# Patient Record
Sex: Female | Born: 1955 | Race: White | Hispanic: No | Smoking: Former smoker
Health system: Southern US, Community
[De-identification: ages and names within clinical notes are randomized; demographics above are authoritative.]

## PROBLEM LIST (undated history)

## (undated) DIAGNOSIS — F419 Anxiety disorder, unspecified: Secondary | ICD-10-CM

## (undated) DIAGNOSIS — F329 Major depressive disorder, single episode, unspecified: Secondary | ICD-10-CM

## (undated) DIAGNOSIS — I714 Abdominal aortic aneurysm, without rupture, unspecified: Secondary | ICD-10-CM

## (undated) DIAGNOSIS — I739 Peripheral vascular disease, unspecified: Secondary | ICD-10-CM

## (undated) DIAGNOSIS — F32A Depression, unspecified: Secondary | ICD-10-CM

## (undated) DIAGNOSIS — I219 Acute myocardial infarction, unspecified: Secondary | ICD-10-CM

## (undated) DIAGNOSIS — K219 Gastro-esophageal reflux disease without esophagitis: Secondary | ICD-10-CM

## (undated) DIAGNOSIS — I6529 Occlusion and stenosis of unspecified carotid artery: Secondary | ICD-10-CM

## (undated) DIAGNOSIS — I719 Aortic aneurysm of unspecified site, without rupture: Secondary | ICD-10-CM

## (undated) DIAGNOSIS — I2510 Atherosclerotic heart disease of native coronary artery without angina pectoris: Secondary | ICD-10-CM

## (undated) DIAGNOSIS — M47816 Spondylosis without myelopathy or radiculopathy, lumbar region: Secondary | ICD-10-CM

## (undated) HISTORY — DX: Peripheral vascular disease, unspecified: I73.9

## (undated) HISTORY — PX: SPINE SURGERY: SHX786

## (undated) HISTORY — PX: ABDOMINAL HYSTERECTOMY: SHX81

## (undated) HISTORY — DX: Acute myocardial infarction, unspecified: I21.9

## (undated) HISTORY — PX: BACK SURGERY: SHX140

## (undated) HISTORY — DX: Abdominal aortic aneurysm, without rupture, unspecified: I71.40

## (undated) HISTORY — PX: CHOLECYSTECTOMY: SHX55

## (undated) HISTORY — DX: Spondylosis without myelopathy or radiculopathy, lumbar region: M47.816

## (undated) HISTORY — DX: Abdominal aortic aneurysm, without rupture: I71.4

---

## 1998-06-20 ENCOUNTER — Ambulatory Visit (HOSPITAL_COMMUNITY): Admission: RE | Admit: 1998-06-20 | Discharge: 1998-06-20 | Payer: Self-pay | Admitting: Neurosurgery

## 1998-07-31 ENCOUNTER — Ambulatory Visit (HOSPITAL_COMMUNITY): Admission: RE | Admit: 1998-07-31 | Discharge: 1998-08-01 | Payer: Self-pay | Admitting: Neurosurgery

## 1998-11-06 ENCOUNTER — Ambulatory Visit (HOSPITAL_COMMUNITY): Admission: RE | Admit: 1998-11-06 | Discharge: 1998-11-06 | Payer: Self-pay | Admitting: Neurosurgery

## 1999-07-04 ENCOUNTER — Emergency Department (HOSPITAL_COMMUNITY): Admission: EM | Admit: 1999-07-04 | Discharge: 1999-07-04 | Payer: Self-pay | Admitting: Emergency Medicine

## 1999-08-13 ENCOUNTER — Emergency Department (HOSPITAL_COMMUNITY): Admission: EM | Admit: 1999-08-13 | Discharge: 1999-08-13 | Payer: Self-pay | Admitting: Emergency Medicine

## 1999-08-15 ENCOUNTER — Encounter: Payer: Self-pay | Admitting: Neurosurgery

## 1999-08-15 ENCOUNTER — Inpatient Hospital Stay (HOSPITAL_COMMUNITY): Admission: EM | Admit: 1999-08-15 | Discharge: 1999-08-18 | Payer: Self-pay | Admitting: Emergency Medicine

## 1999-08-17 ENCOUNTER — Encounter: Payer: Self-pay | Admitting: Neurosurgery

## 2001-09-23 ENCOUNTER — Encounter: Payer: Self-pay | Admitting: Emergency Medicine

## 2001-09-23 ENCOUNTER — Emergency Department (HOSPITAL_COMMUNITY): Admission: EM | Admit: 2001-09-23 | Discharge: 2001-09-23 | Payer: Self-pay | Admitting: Emergency Medicine

## 2001-10-15 ENCOUNTER — Emergency Department (HOSPITAL_COMMUNITY): Admission: EM | Admit: 2001-10-15 | Discharge: 2001-10-15 | Payer: Self-pay

## 2001-10-24 ENCOUNTER — Emergency Department (HOSPITAL_COMMUNITY): Admission: EM | Admit: 2001-10-24 | Discharge: 2001-10-24 | Payer: Self-pay | Admitting: *Deleted

## 2001-11-04 ENCOUNTER — Encounter: Payer: Self-pay | Admitting: Neurosurgery

## 2001-11-04 ENCOUNTER — Ambulatory Visit (HOSPITAL_COMMUNITY): Admission: RE | Admit: 2001-11-04 | Discharge: 2001-11-04 | Payer: Self-pay | Admitting: Neurosurgery

## 2001-11-16 ENCOUNTER — Inpatient Hospital Stay (HOSPITAL_COMMUNITY): Admission: RE | Admit: 2001-11-16 | Discharge: 2001-11-17 | Payer: Self-pay | Admitting: Neurosurgery

## 2001-11-16 ENCOUNTER — Encounter: Payer: Self-pay | Admitting: Neurosurgery

## 2001-11-24 ENCOUNTER — Ambulatory Visit (HOSPITAL_COMMUNITY): Admission: RE | Admit: 2001-11-24 | Discharge: 2001-11-24 | Payer: Self-pay | Admitting: Neurosurgery

## 2003-03-22 ENCOUNTER — Encounter: Admission: RE | Admit: 2003-03-22 | Discharge: 2003-03-22 | Payer: Self-pay | Admitting: Internal Medicine

## 2003-03-27 ENCOUNTER — Ambulatory Visit (HOSPITAL_COMMUNITY): Admission: RE | Admit: 2003-03-27 | Discharge: 2003-03-27 | Payer: Self-pay | Admitting: Internal Medicine

## 2003-05-21 ENCOUNTER — Encounter: Admission: RE | Admit: 2003-05-21 | Discharge: 2003-05-21 | Payer: Self-pay | Admitting: Internal Medicine

## 2003-06-26 ENCOUNTER — Encounter: Admission: RE | Admit: 2003-06-26 | Discharge: 2003-06-26 | Payer: Self-pay | Admitting: Internal Medicine

## 2003-11-22 ENCOUNTER — Emergency Department (HOSPITAL_COMMUNITY): Admission: EM | Admit: 2003-11-22 | Discharge: 2003-11-22 | Payer: Self-pay | Admitting: Family Medicine

## 2004-04-15 ENCOUNTER — Emergency Department (HOSPITAL_COMMUNITY): Admission: EM | Admit: 2004-04-15 | Discharge: 2004-04-15 | Payer: Self-pay | Admitting: Family Medicine

## 2007-08-07 ENCOUNTER — Emergency Department (HOSPITAL_COMMUNITY): Admission: EM | Admit: 2007-08-07 | Discharge: 2007-08-07 | Payer: Self-pay | Admitting: Emergency Medicine

## 2008-03-29 ENCOUNTER — Emergency Department (HOSPITAL_COMMUNITY): Admission: EM | Admit: 2008-03-29 | Discharge: 2008-03-29 | Payer: Self-pay | Admitting: Emergency Medicine

## 2008-04-20 ENCOUNTER — Emergency Department (HOSPITAL_COMMUNITY): Admission: EM | Admit: 2008-04-20 | Discharge: 2008-04-20 | Payer: Self-pay | Admitting: Emergency Medicine

## 2008-07-02 ENCOUNTER — Encounter: Admission: RE | Admit: 2008-07-02 | Discharge: 2008-07-02 | Payer: Self-pay | Admitting: Orthopaedic Surgery

## 2008-07-24 ENCOUNTER — Emergency Department (HOSPITAL_COMMUNITY): Admission: EM | Admit: 2008-07-24 | Discharge: 2008-07-24 | Payer: Self-pay | Admitting: Family Medicine

## 2008-07-30 ENCOUNTER — Ambulatory Visit: Payer: Self-pay | Admitting: *Deleted

## 2008-07-30 ENCOUNTER — Inpatient Hospital Stay (HOSPITAL_COMMUNITY): Admission: EM | Admit: 2008-07-30 | Discharge: 2008-08-02 | Payer: Self-pay | Admitting: Emergency Medicine

## 2008-08-01 ENCOUNTER — Encounter (INDEPENDENT_AMBULATORY_CARE_PROVIDER_SITE_OTHER): Payer: Self-pay | Admitting: *Deleted

## 2008-08-04 ENCOUNTER — Encounter (INDEPENDENT_AMBULATORY_CARE_PROVIDER_SITE_OTHER): Payer: Self-pay | Admitting: Internal Medicine

## 2008-08-07 ENCOUNTER — Ambulatory Visit: Payer: Self-pay

## 2008-08-07 ENCOUNTER — Encounter (INDEPENDENT_AMBULATORY_CARE_PROVIDER_SITE_OTHER): Payer: Self-pay | Admitting: Internal Medicine

## 2009-04-27 ENCOUNTER — Emergency Department (HOSPITAL_COMMUNITY): Admission: EM | Admit: 2009-04-27 | Discharge: 2009-04-27 | Payer: Self-pay | Admitting: Family Medicine

## 2009-12-03 ENCOUNTER — Emergency Department (HOSPITAL_COMMUNITY): Admission: EM | Admit: 2009-12-03 | Discharge: 2009-12-04 | Payer: Self-pay | Admitting: Emergency Medicine

## 2010-03-14 ENCOUNTER — Emergency Department (HOSPITAL_COMMUNITY): Admission: EM | Admit: 2010-03-14 | Discharge: 2010-03-14 | Payer: Self-pay | Admitting: Emergency Medicine

## 2010-03-18 ENCOUNTER — Emergency Department (HOSPITAL_COMMUNITY): Admission: EM | Admit: 2010-03-18 | Discharge: 2010-03-18 | Payer: Self-pay | Admitting: Family Medicine

## 2010-04-20 ENCOUNTER — Emergency Department (HOSPITAL_COMMUNITY): Admission: EM | Admit: 2010-04-20 | Discharge: 2010-04-20 | Payer: Self-pay | Admitting: Emergency Medicine

## 2010-08-03 ENCOUNTER — Emergency Department (HOSPITAL_COMMUNITY): Admission: EM | Admit: 2010-08-03 | Discharge: 2010-08-03 | Payer: Self-pay | Admitting: Emergency Medicine

## 2010-08-06 ENCOUNTER — Emergency Department (HOSPITAL_COMMUNITY): Admission: EM | Admit: 2010-08-06 | Discharge: 2010-08-06 | Payer: Self-pay | Admitting: Family Medicine

## 2010-10-06 ENCOUNTER — Emergency Department (HOSPITAL_COMMUNITY)
Admission: EM | Admit: 2010-10-06 | Discharge: 2010-10-06 | Payer: Self-pay | Source: Home / Self Care | Admitting: Emergency Medicine

## 2010-10-25 ENCOUNTER — Encounter: Payer: Self-pay | Admitting: Family Medicine

## 2010-12-14 LAB — BASIC METABOLIC PANEL
BUN: 11 mg/dL (ref 6–23)
CO2: 22 mEq/L (ref 19–32)
Calcium: 8.4 mg/dL (ref 8.4–10.5)
Chloride: 105 mEq/L (ref 96–112)
Creatinine, Ser: 1 mg/dL (ref 0.4–1.2)
GFR calc Af Amer: >60 mL/min (ref >60)
GFR calc non Af Amer: 58 mL/min — ABNORMAL LOW (ref >60)
Glucose, Bld: 74 mg/dL (ref 70–99)
Potassium: 4 mEq/L (ref 3.5–5.1)
Sodium: 136 mEq/L (ref 135–145)

## 2010-12-14 LAB — DIFFERENTIAL
Basophils Absolute: 0.1 10*3/uL (ref 0.0–0.1)
Basophils Relative: 1 % (ref 0–1)
Eosinophils Absolute: 0.8 10*3/uL — ABNORMAL HIGH (ref 0.0–0.7)
Eosinophils Relative: 8 % — ABNORMAL HIGH (ref 0–5)
Lymphocytes Relative: 33 % (ref 12–46)
Lymphs Abs: 3.2 10*3/uL (ref 0.7–4.0)
Monocytes Absolute: 0.5 10*3/uL (ref 0.1–1.0)
Monocytes Relative: 5 % (ref 3–12)
Neutro Abs: 5 10*3/uL (ref 1.7–7.7)
Neutrophils Relative %: 53 % (ref 43–77)

## 2010-12-14 LAB — CBC
HCT: 38.4 % (ref 36.0–46.0)
Hemoglobin: 12.3 g/dL (ref 12.0–15.0)
MCH: 29.1 pg (ref 26.0–34.0)
MCHC: 32 g/dL (ref 30.0–36.0)
MCV: 90.8 fL (ref 78.0–100.0)
Platelets: 238 10*3/uL (ref 150–400)
RBC: 4.23 MIL/uL (ref 3.87–5.11)
RDW: 14.6 % (ref 11.5–15.5)
WBC: 9.5 10*3/uL (ref 4.0–10.5)

## 2010-12-14 LAB — POCT CARDIAC MARKERS
CKMB, poc: <1 ng/mL — ABNORMAL LOW (ref 1.0–8.0)
Myoglobin, poc: 55.8 ng/mL (ref 12–200)
Troponin i, poc: <0.05 ng/mL (ref 0.00–0.09)

## 2010-12-19 LAB — POCT CARDIAC MARKERS
Myoglobin, poc: 77.9 ng/mL (ref 12–200)
Troponin i, poc: <0.05 ng/mL (ref 0.00–0.09)

## 2010-12-19 LAB — POCT I-STAT, CHEM 8
BUN: 10 mg/dL (ref 6–23)
HCT: 38 % (ref 36.0–46.0)
Hemoglobin: 12.9 g/dL (ref 12.0–15.0)
Sodium: 140 mEq/L (ref 135–145)
TCO2: 27 mmol/L (ref 0–100)

## 2010-12-19 LAB — DIFFERENTIAL
Basophils Absolute: 0.1 10*3/uL (ref 0.0–0.1)
Basophils Relative: 1 % (ref 0–1)
Eosinophils Absolute: 0.6 10*3/uL (ref 0.0–0.7)
Eosinophils Relative: 6 % — ABNORMAL HIGH (ref 0–5)
Lymphocytes Relative: 23 % (ref 12–46)
Lymphs Abs: 2.2 10*3/uL (ref 0.7–4.0)
Monocytes Absolute: 0.6 10*3/uL (ref 0.1–1.0)
Monocytes Relative: 6 % (ref 3–12)
Neutro Abs: 6 10*3/uL (ref 1.7–7.7)
Neutrophils Relative %: 64 % (ref 43–77)

## 2010-12-19 LAB — CBC
HCT: 37.4 % (ref 36.0–46.0)
Hemoglobin: 12.6 g/dL (ref 12.0–15.0)
MCH: 30 pg (ref 26.0–34.0)
MCHC: 33.8 g/dL (ref 30.0–36.0)
MCV: 88.8 fL (ref 78.0–100.0)
Platelets: 228 10*3/uL (ref 150–400)
RBC: 4.21 MIL/uL (ref 3.87–5.11)
RDW: 14.2 % (ref 11.5–15.5)
WBC: 9.4 10*3/uL (ref 4.0–10.5)

## 2010-12-19 LAB — LIPASE, BLOOD: Lipase: 25 U/L (ref 11–59)

## 2010-12-21 LAB — URINALYSIS, ROUTINE W REFLEX MICROSCOPIC
Bilirubin Urine: NEGATIVE
Hgb urine dipstick: NEGATIVE
Nitrite: NEGATIVE
Specific Gravity, Urine: 1.026 (ref 1.005–1.030)
pH: 6 (ref 5.0–8.0)

## 2010-12-21 LAB — DIFFERENTIAL
Lymphocytes Relative: 43 % (ref 12–46)
Lymphs Abs: 3.4 10*3/uL (ref 0.7–4.0)
Monocytes Relative: 6 % (ref 3–12)
Neutro Abs: 3.5 10*3/uL (ref 1.7–7.7)
Neutrophils Relative %: 44 % (ref 43–77)

## 2010-12-21 LAB — POCT I-STAT, CHEM 8
Chloride: 116 mEq/L — ABNORMAL HIGH (ref 96–112)
Creatinine, Ser: 0.7 mg/dL (ref 0.4–1.2)
Glucose, Bld: 92 mg/dL (ref 70–99)
Potassium: 3.3 mEq/L — ABNORMAL LOW (ref 3.5–5.1)

## 2010-12-21 LAB — CBC
RBC: 4.13 MIL/uL (ref 3.87–5.11)
WBC: 8 10*3/uL (ref 4.0–10.5)

## 2011-02-19 NOTE — Op Note (Signed)
[place]. [facility]  Patient:    [name], [name] Visit Number: [id number] MRN: [id number]          Service Type: SUR Location: 3000 [id number] Attending Physician:  Barton Fanny Dictated by:   Hewitt Shorts, M.D. Proc. Date: 11/16/01 Admit Date:  11/16/2001                             Operative Report  PREOPERATIVE DIAGNOSIS:  Recurrent L5-S1 lumbar disk herniation.  POSTOPERATIVE DIAGNOSIS:  Recurrent L5-S1 lumbar disk herniation.  OPERATION PERFORMED:  Left L5-S1 lumbar laminectomy and microdiskectomy.  SURGEON:  Hewitt Shorts, M.D.  ASSISTANT:  Cristi Loron, M.D.  ANESTHESIA:  General endotracheal.  INDICATIONS FOR PROCEDURE:  The patient is a 55 year old woman who presented with recurrent left lumbar radicular pain and was found by MRI scan to have a large recurrent left L5-S1 lumbar disk herniation.  Decision was made to proceed with elective laminotomy and microdiskectomy.  DESCRIPTION OF PROCEDURE:  The patient was brought to the operating room and placed under general endotracheal anesthesia.  The patient was turned to a prone position.  The lumbar region was prepped with Betadine soap and solution and draped in sterile fashion.  The midline was infiltrated with local anesthetic with epinephrine.  The previous midline incision was made and carried down through the subcutaneous tissues.  Bipolar cautery and electrocautery were used to maintain hemostasis.  Dissection was carried down to the lumbar fascia which was incised from the left side to the midline and then the paraspinal muscles were dissected from the spinous processes and lamina in the subperiosteal fashion.  A self-retaining retractor was placed and the microscope draped and brought into the field to provide additional magnification, illumination and visualization and the remainder of the procedure was performed using microdissection and microsurgical  technique. We identified the previous laminotomy and an x-ray was taken to confirm the localization.  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  This central fragment was removed and spondylitic overgrowth that had resulted from that was removed as well.  We were able to begin to explore the epidural space.  The left L5 and left S1 nerve root were identified as well as the thecal sac.  Careful dissection was carried out in the ventral epidural  space and we were able to expose the disk herniation which was adherent to the ventral surface of the thecal sac.  Several large fragments of disk material were removed.  We then entered into the disk space and removed additional degenerated disk material. A thorough diskectomy was performed removing all loose fragments of disk material in both the disk space and the epidural space.  In the end, good decompression of the thecal sac and nerve root was achieved.  The wound was then irrigated extensively with bacitracin solution and checked for hemostasis and then we proceeded with closure.  The deep fascia was closed with interrupted undyed 0 Vicryl suture, the subcutaneous and subcuticular layer were closed with interrupted inverted 2-0 undyed Vicryl sutures and the skin edges were approximated with Dermabond.  The patient tolerated the procedure well.  Estimated blood loss was less than 50 cc.  Sponge, needle and instrument counts were correct.  Following surgery the patient was turned back to a supine position to be reversed from anesthetic, extubated and transferred to  the recovery room for further care. Dictated by:   Hewitt Shorts, M.D. Attending Physician:  Barton Fanny DD:  11/16/01 TD:  11/16/01 Job: [id number]

## 2011-02-19 NOTE — H&P (Signed)
[place]. [facility]  Patient:    [name]                         MRN: [id number] Adm. Date:  [id number] Attending:  Barton Fanny CC:         Hewitt Shorts, M.D.                         History and Physical  HISTORY OF PRESENT ILLNESS:  Patient is a 55 year old right-handed white female who presented to [facility] Emergency Room for evaluation.  She says that two months ago she injured her back when she was changing a tire on her car. She felt a grabbing pain in the left side of her low back and subsequently had stiffness that would tend to come and go.  Two days ago though, she awoke with severe pain into the left lower extremity with pain into the left buttock, thigh and leg.  At this point, the worst of the pain is in the left leg.  She describes it as a burning searing pain.  She does not describe any numbness or paresthesias but she has a sense of weakness in her left lower extremity.  She has been using Ultram at home without relief and has been unable to find a comfortable position.  PAST MEDICAL HISTORY:  There is Alvarado history of hypertension, myocardial infarction, cancer, stroke, diabetes, peptic ulcer disease or lung disease.  PREVIOUS SURGICAL HISTORY:  C5-6 and C6-7 anterior cervical diskectomies and arthrodeses with bone graft and plating by Cristi Loron, M.D. in October f 1999.  She is also status post hysterectomy and cholecystectomy.  ALLERGIES:  She denies any allergies to medications; however, she does have an allergy to EGGS.  MEDICATIONS:  Her only current medication is Ultram.  FAMILY HISTORY:  Her mother died at age 79; she had diabetes mellitus.  Her father died at age 75; he had prostate cancer.  SOCIAL HISTORY:  Patient is divorced.  She has three grown children, one of whom lives with her.  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  She smokes a pack a day; she has been smoking for 16 years.  She does not drink alcoholic beverages.  REVIEW OF SYSTEMS:  Notable for those difficulties described in her history of present illness and past medical history but is otherwise unremarkable.  PHYSICAL EXAMINATION:  GENERAL:  Patient is a well-developed, well-nourished white female in obvious pain and discomfort.  VITAL SIGNS:  Temperature is 98.5, pulse 89, blood pressure 129/71, respiratory  rate 20.  LUNGS:  Clear to auscultation.  HEART:  Regular rate and rhythm.  Normal S1 and S2.  There is Alvarado murmur.  ABDOMEN:  Soft, nondistended.  Bowel sounds are present.  EXTREMITIES:  Alvarado clubbing, cyanosis, or edema.  VASCULAR:  Examination shows 1 to 2+ pulses at the dorsalis pedis and posterior  tibials bilaterally.  MUSCULOSKELETAL:  Alvarado tenderness to palpation over the lumbar spinous processes r paravertebral musculature.  She has a positive straight leg raising on the left at 20 degrees and a positive crossed straight leg raising on the right with pain into the left lower extremity at about 40 degrees.  NEUROLOGIC:  Mild weakness of the left extensor hallucis longus, 4-/5; however, the left dorsiflexion and plantarflexion are 5/5 in  all the musculature and the distal right lower extremity is 5/5.  Sensory examination shows intact sensation to pinprick.  Reflexes are 1 to 2 at the quadriceps and gastrocnemius bilaterally.  Toes are downgoing bilaterally.  IMPRESSION:  Acute left lumbar radiculopathy with extensor hallucis longus weakness.  PLAN:  The patient will be admitted for parenteral analgesics.  MRI of the lumbar spine without gadolinium will be obtained.  She will be placed on bedrest with bathroom privileges and further recommendations await the results of her MRI. Admission laboratories including a lumbosacral spine x-ray series with flexion nd extension views will be done. DD:   08/15/99 TD:  08/16/99 Job: [id number]

## 2011-02-19 NOTE — Discharge Summary (Signed)
[name], [name] NO.:  [id number]   MEDICAL RECORD NO.:  [id number]          PATIENT TYPE:  INP   LOCATION:  3709                         FACILITY:  [facility]   PHYSICIAN:  Manning Charity, MD     DATE OF BIRTH:  25-Dec-1955   DATE OF ADMISSION:  07/30/2008  DATE OF DISCHARGE:  08/02/2008                               DISCHARGE SUMMARY   DISCHARGE DIAGNOSES  1. Acute upper respiratory tract infection.  2. Hyperlipidemia.  3. Tobacco abuse, ongoing.  4. History of cholecystectomy in 1985.  5. History of surgery for cervical spine in 2000.  6. Surgery for lumbar disk herniation in 2002.  7. History of hysterectomy in 1995.  8. Abdominal aortic aneurysm (3.1-mm size).  9. Chronic lower back pain and chronic left lower extremity pain.   DISCHARGE MEDICATIONS   1. Avelox 400 mg once daily for 3 more days.  2. Multivitamins 1 tablet once daily.  3. Prednisone 20 mg tomorrow and 10 mg the next day and then stop.  4. Albuterol inhaler 1 puff every 4 hours as needed for shortness of      breath or wheezing.  5. Aspirin 81 mg 1 pill once daily.  6. Pravastatin 40 mg 1 pill once daily.   DISPOSITION AND FOLLOWUP   The patient is to follow up with Outpatient [facility]  on August 27, 2008 at 2 p.m. with Dr. Elizebeth Alvarado and the patient is to  follow up with [facility] on [facility] for stress test on  August 08, 2008 at 8 a.m.   PROCEDURES PERFORMED  1. Chest x-ray.  Impression:  Peribronchial thickening, no active      disease.  2. Ultrasound abdomen.  Impression:  No acute findings.  Liver appears      normal.  Status post cholecystectomy.  Distal abdominal aorta is      minimally dilated (2.6 cm).  3. Cardiac enzymes, point of care markers, so CK-MB 1.5, troponin I      0.05, myoglobin 97.9, D-dimer 0.64.  fasting lipid profile, total      cholesterol 201, triglycerides 98, HDL cholesterol 34, LDL      cholesterol 149,  and VLDL  cholesterol 18.  Second set of cardiac      enzymes, total creatinine kinase 158, CK-MB 1.1, troponin I 0.02.      BNP 69, hemoglobin A1c 5.3.  TSH 0.443.  Free T4 0.96.  HIV      antibody nonreactive.  Second set of cardiac enzymes, total      creatinine 63, CK-MB 0.7, troponin I less than 0.01.  Third set of      cardiac enzymes, total creatinine kinase 42, CK-MB 0.8, troponin I      0.01.  Acute hepatitis panel, hepatitis B surface antigen negative,      hepatitis B core antibody negative, hepatitis A antibody negative,      hepatitis C antibody negative, and blood cultures negative.   CONSULTATIONS  None.   BRIEF ADMITTING HISTORY AND PHYSICAL   A 52-year lady with no  significant past medical history except for back  surgeries, comes to the emergency department with chief complaints of  cough for the last 3 weeks and shortness of breath over the last 1 week.  Complaint started 3 weeks back with cough, productive with mucoid  sputum.  Shortness of breath started 1 week back, gradually worsening,  increased with walking and decreased by resting.  The patient walks in  hemodialysis in [place] and could not go to work over the last 1  week.  The patient was seen in Urgent Care 1 week back for cough and  shortness of breath and was started on Tamiflu.  She finished her last  dose of Tamiflu today.  The patient complains of nausea and vomiting  secondary to prolonged coughing.  The patient denies any runny nose,  fever, or chills.  The patient complains that she noticed a change in  her voice over the last 1 week and feels lack of energy all the day.  The patient was seen in the Urgent Care Clinic today and EKG showed ST  changes in inferolateral leads.  The patient denies any chest pain, but  complains of palpitations associated with the shortness of breath and  cough.  She denies any other complaints.  She complains of hoarseness of  voice for the last 1 week.   PHYSICAL  EXAMINATION  VITAL SIGNS:  Temperature 98.1, blood pressure 181/96, pulse 86,  respiratory rate 18, and oxygen saturation 97% on room air.  GENERAL:  She was lying on the bed, not in any acute distress.  Coughing  occasionally.  EYES:  Pupils equal, round and reacting to light.  SKIN:  No jaundice.  ENT:  Angular cheilitis.  Mild hoarseness of voice.  NECK:  No JVD.  No carotid bruits.  Neck is supple.  RESPIRATORY:  Bilateral diffuse wheezing and occasional fine basilar  crackles.  No rhonchi.  Good air movement bilaterally.  CARDIOVASCULAR:  S1 and S2.  Regular rate and rhythm.  No murmurs.  GASTROINTESTINAL:  Abdomen is soft, nondistended, and nontender.  Bowel  sounds positive.  EXTREMITIES:  No pedal edema.  NEUROLOGICAL:  Nonfocal.  PSYCH:  Appropriate.   LABS AT THE TIME OF ADMISSION   White count 8.8, hemoglobin is 14.6, hematocrit 43.6, and platelet count  226.  Comprehensive metabolic panel, sodium 140, potassium 3.3, chloride  108, carbon dioxide 25, glucose 87, BUN 9, creatinine 0.5, total  bilirubin is 0.3, alkaline phosphatase 97, AST 0.8, ALT 0.52, total  protein 7.3, albumin 3.3, and calcium 9.1.   HOSPITAL COURSE   1. Acute respiratory tract infection.  Given the patient's history of      cough with productive cough, was started on empiric antibiotic      treatment with Avelox and started on prednisone taper.  We started      on prednisone taper, given wheezing on physical examination and      history of tobacco abuse for a long time.  The patient responded      excellently to the antibiotic and steroid treatment and the patient      did not have any complaints in the remaining part of the hospital      stay.  Secondary to the patient's ST-segment changes during the      hospital stay, we admitted her to Telemetry and did cardiac enzymes      x3.  Cardiac enzymes were negative.  The patient is to follow up  with [facility] for a stress test on  August 08, 2008, at 8      a.m.  The patient is to continue the prednisone taper as well as to      finish the full course of antibiotic therapy.  2. Hyperlipidemia.  We started her on pravastatin 40 mg once daily.   DISCHARGE VITALS   Temperature 97.0, blood pressure 112/57, pulse rate 61, respiratory rate  20, and oxygen saturation 95% on room air.   The day of discharge, the patient is stable, alert, and oriented and is  not in any acute distress.  The patient is to follow up with Outpatient  Clinic at [facility] on August 27, 2008, and is to follow up  with [facility] for stress test on August 08, 2008.      Blondell Reveal, MD  Electronically Signed      Manning Charity, MD  Electronically Signed    VB/MEDQ  D:  09/19/2008  T:  09/20/2008  Job:  [id number]

## 2011-03-09 ENCOUNTER — Emergency Department (HOSPITAL_COMMUNITY)
Admission: EM | Admit: 2011-03-09 | Discharge: 2011-03-09 | Disposition: A | Discharge status: Home / Self Care | Payer: Self-pay | Attending: Emergency Medicine | Admitting: Emergency Medicine

## 2011-03-09 DIAGNOSIS — M549 Dorsalgia, unspecified: Secondary | ICD-10-CM | POA: Insufficient documentation

## 2011-03-09 DIAGNOSIS — F17200 Nicotine dependence, unspecified, uncomplicated: Secondary | ICD-10-CM | POA: Insufficient documentation

## 2011-03-25 ENCOUNTER — Other Ambulatory Visit (HOSPITAL_COMMUNITY): Payer: Self-pay | Admitting: Orthopaedic Surgery

## 2011-03-25 DIAGNOSIS — I739 Peripheral vascular disease, unspecified: Secondary | ICD-10-CM

## 2011-03-26 ENCOUNTER — Ambulatory Visit (HOSPITAL_COMMUNITY)
Admission: RE | Admit: 2011-03-26 | Discharge: 2011-03-26 | Disposition: A | Discharge status: Home / Self Care | Payer: Self-pay | Source: Ambulatory Visit | Attending: Orthopaedic Surgery | Admitting: Orthopaedic Surgery

## 2011-03-26 ENCOUNTER — Other Ambulatory Visit (HOSPITAL_COMMUNITY): Payer: Self-pay | Admitting: Orthopaedic Surgery

## 2011-03-26 DIAGNOSIS — I739 Peripheral vascular disease, unspecified: Secondary | ICD-10-CM | POA: Insufficient documentation

## 2011-03-26 DIAGNOSIS — I743 Embolism and thrombosis of arteries of the lower extremities: Secondary | ICD-10-CM | POA: Insufficient documentation

## 2011-04-10 ENCOUNTER — Encounter: Payer: Self-pay | Admitting: Emergency Medicine

## 2011-04-10 ENCOUNTER — Emergency Department (HOSPITAL_COMMUNITY)
Admission: EM | Admit: 2011-04-10 | Discharge: 2011-04-10 | Disposition: A | Discharge status: Home / Self Care | Payer: Self-pay | Attending: Emergency Medicine | Admitting: Emergency Medicine

## 2011-04-10 ENCOUNTER — Emergency Department (HOSPITAL_COMMUNITY): Payer: Self-pay

## 2011-04-10 DIAGNOSIS — R1084 Generalized abdominal pain: Secondary | ICD-10-CM

## 2011-04-10 DIAGNOSIS — Z79899 Other long term (current) drug therapy: Secondary | ICD-10-CM | POA: Insufficient documentation

## 2011-04-10 DIAGNOSIS — R109 Unspecified abdominal pain: Secondary | ICD-10-CM | POA: Insufficient documentation

## 2011-04-10 DIAGNOSIS — F17200 Nicotine dependence, unspecified, uncomplicated: Secondary | ICD-10-CM | POA: Insufficient documentation

## 2011-04-10 HISTORY — DX: Anxiety disorder, unspecified: F41.9

## 2011-04-10 HISTORY — DX: Depression, unspecified: F32.A

## 2011-04-10 HISTORY — DX: Gastro-esophageal reflux disease without esophagitis: K21.9

## 2011-04-10 HISTORY — DX: Aortic aneurysm of unspecified site, without rupture: I71.9

## 2011-04-10 HISTORY — DX: Major depressive disorder, single episode, unspecified: F32.9

## 2011-04-10 LAB — BASIC METABOLIC PANEL
BUN: 17 mg/dL (ref 6–23)
CO2: 24 mEq/L (ref 19–32)
GFR calc non Af Amer: 59 mL/min — ABNORMAL LOW (ref >60)
Glucose, Bld: 75 mg/dL (ref 70–99)
Potassium: 4.2 mEq/L (ref 3.5–5.1)

## 2011-04-10 LAB — URINE MICROSCOPIC-ADD ON

## 2011-04-10 LAB — DIFFERENTIAL
Eosinophils Absolute: 1.5 10*3/uL — ABNORMAL HIGH (ref 0.0–0.7)
Lymphocytes Relative: 36 % (ref 12–46)
Lymphs Abs: 3.4 10*3/uL (ref 0.7–4.0)
Monocytes Relative: 6 % (ref 3–12)
Neutrophils Relative %: 42 % — ABNORMAL LOW (ref 43–77)

## 2011-04-10 LAB — URINALYSIS, ROUTINE W REFLEX MICROSCOPIC
Bilirubin Urine: NEGATIVE
Glucose, UA: NEGATIVE mg/dL
Specific Gravity, Urine: 1.02 (ref 1.005–1.030)
pH: 5.5 (ref 5.0–8.0)

## 2011-04-10 LAB — CBC
Hemoglobin: 11.8 g/dL — ABNORMAL LOW (ref 12.0–15.0)
MCH: 26.2 pg (ref 26.0–34.0)
MCV: 82.7 fL (ref 78.0–100.0)
RBC: 4.51 MIL/uL (ref 3.87–5.11)
WBC: 9.5 10*3/uL (ref 4.0–10.5)

## 2011-04-10 MED ORDER — SODIUM CHLORIDE 0.9 % IV SOLN
999.0000 mL | INTRAVENOUS | Status: DC
  Administered 2011-04-10: 999 mL via INTRAVENOUS

## 2011-04-10 MED ORDER — ONDANSETRON HCL 4 MG PO TABS: 4.0000 mg | ORAL_TABLET | Freq: Four times a day (QID) | ORAL | Status: AC

## 2011-04-10 MED ORDER — HYDROMORPHONE HCL 2 MG PO TABS: 2.0000 mg | ORAL_TABLET | ORAL | Status: AC | PRN

## 2011-04-10 MED ORDER — IOHEXOL 300 MG/ML  SOLN: 100.0000 mL | Freq: Once | INTRAMUSCULAR | Status: DC | PRN

## 2011-04-10 NOTE — ED Notes (Signed)
Patient is resting comfortably. 

## 2011-04-10 NOTE — ED Provider Notes (Signed)
History     Chief Complaint  Patient presents with  . Abdominal Pain   HPI  Past Medical History  Diagnosis Date  . Aortic aneurysm   . Depression   . Anxiety   . Acid reflux     Past Surgical History  Procedure Date  . Cholecystectomy   . Back surgery   . Abdominal hysterectomy     History reviewed. No pertinent family history.  History  Substance Use Topics  . Smoking status: Current Everyday Smoker -- 0.5 packs/day    Types: Cigarettes  . Smokeless tobacco: Not on file  . Alcohol Use: No    OB History    Grav Para Term Preterm Abortions TAB SAB Ect Mult Living                  Review of Systems  Physical Exam  BP 150/66  Pulse 55  Temp(Src) 98 F (36.7 C) (Oral)  Resp 16  Ht 5\' 4"  (1.626 m)  Wt 130 lb (58.968 kg)  BMI 22.31 kg/m2  SpO2 97%  Physical Exam  ED Course  Procedures  MDM CT SCAN ORERED TO EVAL NEW ABDOMINAL PAIN. THIS COULD BE CW INTESTINAL AGNINA SINCE PATIENT HAS A HX OF AAA AND COULD HAVE SOME COMPROMISED INTESTINAL BLOOD FLOW. AND IT IS WORSE WITH EATING WHICH IS SUGGESTIVE OF THAT.       Shelda Jakes, MD 04/10/11 [phone]

## 2011-04-10 NOTE — ED Notes (Signed)
Pt c/o pain in her left lower abdomen and left lower back off and on x 1 year. Pt also c/o constipation x 3 days.  Pt denies nausea, vomiting and diarrhea. Denies urinary symptoms. Alert and oriented x 3. Skin warm and dry. Color pink. Breath sounds clear and equal bilaterally. Ambulates without difficulty.

## 2011-04-10 NOTE — ED Notes (Signed)
Pt c/o pain to L upper abd x 3 days ago. States she has a current AAA in this area and she is concerned denies n/v/d. NAD. Last normal bm x 3 days ago. bm yesterday and was hard. Mm wet.

## 2011-04-10 NOTE — ED Notes (Signed)
Assisted patient to walk to bathroom.

## 2011-04-19 ENCOUNTER — Encounter (INDEPENDENT_AMBULATORY_CARE_PROVIDER_SITE_OTHER): Payer: Self-pay | Admitting: Vascular Surgery

## 2011-04-19 DIAGNOSIS — M79609 Pain in unspecified limb: Secondary | ICD-10-CM

## 2011-04-20 NOTE — Consult Note (Signed)
NEW PATIENT CONSULTATION  Alvarado, Denise L DOB:  1956/05/11                                       04/19/2011 [id number]#:[id number]  The patient is a 55 year old female referred by Dr. Ophelia Charter for bilateral claudication symptoms, left worse than right.  This patient has had previous lumbar spine disease and has had lumbar laminectomy in 2000, redo in 2002 and also has a history of a back injury.  She states that her legs hurt her at rest on occasion, left worse than right.  If she ambulates she is only able to get about one half block before stopping because of severe left calf discomfort to be followed by right calf discomfort and numbness in both feet.  She has no history of nonhealing ulcers or infection.  Recent ABIs were performed which revealed the left to be 0.61 and the right 0.84.  CHRONIC MEDICAL PROBLEMS: 1. Degenerative joint disease, previous lumbar spine surgery in 2000     and redo surgery in 2002. 2. History of tobacco abuse 1 pack per day for approximately 30 years. 3. GERD. 4. Negative for diabetes, hypertension, hyperlipidemia, coronary     artery disease, COPD or stroke.  SOCIAL HISTORY:  She is single, has 3 children, works as a Armed forces logistics/support/administrative officer.  Has smoked a pack of cigarettes per day for 30+ years and currently smokes a half pack per day.  FAMILY HISTORY:  Positive for diabetes in her mother and sister. Negative for coronary artery disease and stroke.  REVIEW OF SYSTEMS:  Positive for anorexia, headache, leg discomfort with walking, depression, anxiety, reflux esophagitis.  Also denies any chest pain, dyspnea on exertion, PND, orthopnea.  PHYSICAL EXAMINATION:  Vital signs:  Blood pressure 166/56, heart rate 79, respirations 16.  General:  She is a well-developed, well-nourished middle-aged female in no apparent distress, alert and oriented x3. HEENT:  Exam normal for age.  Lungs:  Clear to auscultation.  No rhonchi or wheezing.   Cardiovascular:  Regular rhythm.  No murmurs.  Carotid pulses 3+.  No audible bruits.  Abdomen:  Soft, nontender with no masses.  Musculoskeletal:  Exam is free of major deformities. Neurological:  Normal.  Skin:  Free of rashes.  Lower extremities: Reveals 3+ femoral, 2+ popliteal and 2+ dorsalis pedis on the right. Left leg has a 2-3+ femoral, absent popliteal and distal pulses.  The patient also has a small abdominal aortic aneurysm which was noted on CT scan performed 04/10/2011 and atrophy of the right kidney with tiny nonobstructing intrarenal calculus and hypertrophy of the left kidney.  IMPRESSION:  Bilateral claudication symptoms, left worse than right, with history of lumbar disk disease.  I do not believe all of her symptoms are due to vascular disease but she feels that this is limiting her significantly.  Also has history of abdominal aortic aneurysm which is 3.5 cm, right renal atrophy, tobacco abuse, degenerative joint disease.  PLAN:  To obtain an angiogram with Dr. Myra Gianotti on July 24 and proceed with any percutaneous intervention that is feasible to improve her claudication symptoms.  Risks and benefits have been thoroughly discussed and she would like to proceed.    Quita Skye Hart [place], M.D. Electronically Signed  JDL/MEDQ  D:  04/19/2011  T:  04/20/2011  Job:  [id number]  cc:   Lonia Blood, M.D. Veverly Fells. Ophelia Charter, M.D.

## 2011-04-27 ENCOUNTER — Ambulatory Visit (HOSPITAL_COMMUNITY)
Admission: RE | Admit: 2011-04-27 | Discharge: 2011-04-27 | Disposition: A | Discharge status: Home / Self Care | Payer: Self-pay | Source: Ambulatory Visit | Attending: Surgery | Admitting: Surgery

## 2011-04-27 DIAGNOSIS — M1990 Unspecified osteoarthritis, unspecified site: Secondary | ICD-10-CM | POA: Insufficient documentation

## 2011-04-27 DIAGNOSIS — I708 Atherosclerosis of other arteries: Secondary | ICD-10-CM | POA: Insufficient documentation

## 2011-04-27 DIAGNOSIS — I70219 Atherosclerosis of native arteries of extremities with intermittent claudication, unspecified extremity: Secondary | ICD-10-CM | POA: Insufficient documentation

## 2011-04-27 DIAGNOSIS — K219 Gastro-esophageal reflux disease without esophagitis: Secondary | ICD-10-CM | POA: Insufficient documentation

## 2011-04-27 DIAGNOSIS — F17200 Nicotine dependence, unspecified, uncomplicated: Secondary | ICD-10-CM | POA: Insufficient documentation

## 2011-04-27 DIAGNOSIS — N200 Calculus of kidney: Secondary | ICD-10-CM | POA: Insufficient documentation

## 2011-04-27 DIAGNOSIS — I714 Abdominal aortic aneurysm, without rupture, unspecified: Secondary | ICD-10-CM | POA: Insufficient documentation

## 2011-04-27 HISTORY — PX: OTHER SURGICAL HISTORY: SHX169

## 2011-04-27 LAB — POCT I-STAT, CHEM 8
HCT: 36 % (ref 36.0–46.0)
Hemoglobin: 12.2 g/dL (ref 12.0–15.0)
Potassium: 4.2 mEq/L (ref 3.5–5.1)
Sodium: 137 mEq/L (ref 135–145)
TCO2: 22 mmol/L (ref 0–100)

## 2011-05-06 ENCOUNTER — Encounter: Payer: Self-pay | Admitting: Vascular Surgery

## 2011-05-07 ENCOUNTER — Encounter: Payer: Self-pay | Admitting: Vascular Surgery

## 2011-05-11 ENCOUNTER — Ambulatory Visit (INDEPENDENT_AMBULATORY_CARE_PROVIDER_SITE_OTHER): Payer: Self-pay | Admitting: Vascular Surgery

## 2011-05-11 ENCOUNTER — Encounter: Payer: Self-pay | Admitting: Vascular Surgery

## 2011-05-11 VITALS — BP 197/69 | HR 69 | Temp 98.2°F | Ht 64.0 in | Wt 130.0 lb

## 2011-05-11 DIAGNOSIS — I70219 Atherosclerosis of native arteries of extremities with intermittent claudication, unspecified extremity: Secondary | ICD-10-CM

## 2011-05-11 DIAGNOSIS — I739 Peripheral vascular disease, unspecified: Secondary | ICD-10-CM

## 2011-05-11 NOTE — Progress Notes (Signed)
Subjective:     Patient ID: [name], female   DOB: Jun 01, 1956, 55 y.o.   MRN: [id number]  HPI this patient returns to discuss the results of the diagnostic angiogram performed by Dr. Myra Gianotti on 04/27/2011. This patient has severe bilateral claudication beginning in the calves extending up to the thighs and hips which limit her severely. She is unable to ambulate more than about500 yards without stopping. She has no history of rest pain or nonhealing ulcers or gangrene.  I have reviewed her angiogram today. She has a dilated infrarenal aorta. She has terrible bilateral iliac occlusive disease with small caliber common and external iliac arteries. She has diffuse superficial femoral disease bilaterally with no area of total occlusion and a small vessel bilaterally.  Past Medical History  Diagnosis Date  . Aortic aneurysm   . Depression   . Anxiety   . Acid reflux   . Claudication   . DJD (degenerative joint disease) of lumbar spine     History  Substance Use Topics  . Smoking status: Current Everyday Smoker -- 0.5 packs/day for 30 years    Types: Cigarettes  . Smokeless tobacco: Not on file  . Alcohol Use: No    Family History  Problem Relation Age of Onset  . Diabetes Mother   . Diabetes Sister     Allergies  Allergen Reactions  . Celebrex (Celecoxib)   . Eggs Or Egg-Derived Products     REACTION: tolerates flu vaccine, though    Current outpatient prescriptions:ALPRAZolam (XANAX) 0.5 MG tablet, Take 0.5 mg by mouth 3 (three) times daily as needed.  , Disp: , Rfl: ;  DIAZEPAM PO, Take 5 mg by mouth 3 (three) times daily.  , Disp: , Rfl: ;  OMEPRAZOLE PO, Take 40 mg by mouth daily.  , Disp: , Rfl: ;  Sertraline HCl (ZOLOFT PO), Take 150 mg by mouth 1 day or 1 dose.  , Disp: , Rfl: ;  TRAMADOL HCL PO, Take 50 mg by mouth every 6 (six) hours as needed.  , Disp: , Rfl:  HYDROmorphone (DILAUDID) 2 MG tablet, Take 2 mg by mouth every 4 (four) hours as needed.  , Disp: , Rfl:    Filed Vitals:   05/11/11 0930  Height: 5\' 4"  (1.626 m)  Weight: 130 lb (58.968 kg)    Body mass index is 22.31 kg/(m^2).  Review of Systems  Constitutional: Negative for fever, chills, activity change, appetite change, fatigue and unexpected weight change.  HENT: Negative for hearing loss, congestion, sore throat, trouble swallowing, neck pain and voice change.   Eyes: Negative for visual disturbance.  Respiratory: Negative for cough, chest tightness, shortness of breath, wheezing and stridor.   Cardiovascular: Negative for chest pain, palpitations and leg swelling.  Gastrointestinal: Negative for nausea, abdominal pain, diarrhea, constipation, blood in stool and abdominal distention.  Genitourinary: Negative for dysuria, urgency, frequency, hematuria and difficulty urinating.  Musculoskeletal: Positive for back pain and arthralgias. Negative for joint swelling and gait problem.  Skin: Negative for color change, pallor, rash and wound.  Neurological: Negative for dizziness, seizures, syncope, facial asymmetry, speech difficulty, weakness, light-headedness, numbness and headaches.  Hematological: Negative for adenopathy. Does not bruise/bleed easily.  Psychiatric/Behavioral: Negative for confusion.       Objective:   Physical Exam  Constitutional: She is oriented to person, place, and time. She appears well-developed and well-nourished.  HENT:  Head: Normocephalic.  Nose: Nose normal.  Mouth/Throat: Oropharynx is clear and moist.  Eyes: EOM  are normal. Pupils are equal, round, and reactive to light. No scleral icterus.  Neck: Normal range of motion. Neck supple. No JVD present.  Cardiovascular: Normal rate, regular rhythm and normal heart sounds.   No murmur heard.      She has 2+ femoral pulses palpable bilaterally. Popliteal and distal pulses are not palpable in either lower . Extremity. Both feet are pink and well perfused.  Pulmonary/Chest: Effort normal and breath sounds  normal. No stridor. No respiratory distress. She has no wheezes. She has no rales.  Abdominal: Soft. She exhibits no distension and no mass. There is no tenderness. There is no rebound and no guarding.  Musculoskeletal: Normal range of motion. She exhibits no edema and no tenderness.  Lymphadenopathy:    She has no cervical adenopathy.  Neurological: She is alert and oriented to person, place, and time. Coordination normal.  Skin: Skin is warm and dry. No rash noted. No erythema.  Psychiatric: She has a normal mood and affect. Her behavior is normal.  Blood pressure 197/69, pulse 69, temperature 98.2 F (36.8 C), temperature source Oral, height 5\' 4"  (1.626 m), weight 130 lb (58.968 kg).     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 #3 risks and benefits of aortobifemoral surgery have been thoroughly discussed with patient and she would like to proceed.

## 2011-05-12 ENCOUNTER — Other Ambulatory Visit (HOSPITAL_COMMUNITY): Payer: Self-pay | Admitting: Cardiovascular Disease

## 2011-05-12 DIAGNOSIS — Z01818 Encounter for other preprocedural examination: Secondary | ICD-10-CM

## 2011-05-13 ENCOUNTER — Encounter (HOSPITAL_COMMUNITY)
Admission: RE | Admit: 2011-05-13 | Discharge: 2011-05-13 | Disposition: A | Discharge status: Home / Self Care | Payer: Self-pay | Source: Ambulatory Visit | Attending: Vascular Surgery | Admitting: Vascular Surgery

## 2011-05-13 ENCOUNTER — Ambulatory Visit (HOSPITAL_COMMUNITY)
Admission: RE | Admit: 2011-05-13 | Discharge: 2011-05-13 | Disposition: A | Discharge status: Home / Self Care | Payer: Self-pay | Source: Ambulatory Visit | Attending: Vascular Surgery | Admitting: Vascular Surgery

## 2011-05-13 ENCOUNTER — Other Ambulatory Visit: Payer: Self-pay | Admitting: Vascular Surgery

## 2011-05-13 DIAGNOSIS — R52 Pain, unspecified: Secondary | ICD-10-CM

## 2011-05-13 DIAGNOSIS — Z01818 Encounter for other preprocedural examination: Secondary | ICD-10-CM | POA: Insufficient documentation

## 2011-05-13 DIAGNOSIS — Z01812 Encounter for preprocedural laboratory examination: Secondary | ICD-10-CM | POA: Insufficient documentation

## 2011-05-13 LAB — COMPREHENSIVE METABOLIC PANEL
ALT: 5 U/L (ref 0–35)
Alkaline Phosphatase: 97 U/L (ref 39–117)
CO2: 18 mEq/L — ABNORMAL LOW (ref 19–32)
Calcium: 9.1 mg/dL (ref 8.4–10.5)
GFR calc Af Amer: >60 mL/min (ref >60)
GFR calc non Af Amer: 59 mL/min — ABNORMAL LOW (ref >60)
Glucose, Bld: 86 mg/dL (ref 70–99)
Sodium: 136 mEq/L (ref 135–145)

## 2011-05-13 LAB — BLOOD GAS, ARTERIAL
Drawn by: 206361
FIO2: 0.21 %
pCO2 arterial: 31.1 mmHg — ABNORMAL LOW (ref 35.0–45.0)
pH, Arterial: 7.405 — ABNORMAL HIGH (ref 7.350–7.400)
pO2, Arterial: 122 mmHg — ABNORMAL HIGH (ref 80.0–100.0)

## 2011-05-13 LAB — URINALYSIS, ROUTINE W REFLEX MICROSCOPIC
Glucose, UA: NEGATIVE mg/dL
Protein, ur: NEGATIVE mg/dL
Specific Gravity, Urine: 1.022 (ref 1.005–1.030)
pH: 5.5 (ref 5.0–8.0)

## 2011-05-13 LAB — ABO/RH: ABO/RH(D): A POS

## 2011-05-13 LAB — URINE MICROSCOPIC-ADD ON

## 2011-05-13 LAB — CBC
Hemoglobin: 12.6 g/dL (ref 12.0–15.0)
RBC: 4.59 MIL/uL (ref 3.87–5.11)
WBC: 10 10*3/uL (ref 4.0–10.5)

## 2011-05-18 ENCOUNTER — Encounter (HOSPITAL_COMMUNITY)
Admission: RE | Admit: 2011-05-18 | Discharge: 2011-05-18 | Disposition: A | Discharge status: Home / Self Care | Payer: Self-pay | Source: Ambulatory Visit | Attending: Cardiovascular Disease | Admitting: Cardiovascular Disease

## 2011-05-18 DIAGNOSIS — Z01818 Encounter for other preprocedural examination: Secondary | ICD-10-CM | POA: Insufficient documentation

## 2011-05-18 MED ORDER — TECHNETIUM TC 99M TETROFOSMIN IV KIT
10.0000 | PACK | Freq: Once | INTRAVENOUS | Status: AC | PRN
  Administered 2011-05-18: 10 via INTRAVENOUS

## 2011-05-18 MED ORDER — TECHNETIUM TC 99M TETROFOSMIN IV KIT
30.0000 | PACK | Freq: Once | INTRAVENOUS | Status: AC | PRN
  Administered 2011-05-18: 30 via INTRAVENOUS

## 2011-05-19 ENCOUNTER — Inpatient Hospital Stay (HOSPITAL_COMMUNITY): Admission: RE | Admit: 2011-05-19 | Payer: Self-pay | Source: Ambulatory Visit | Admitting: Vascular Surgery

## 2011-05-27 LAB — CROSSMATCH

## 2011-06-03 NOTE — Op Note (Signed)
  [name], [name]                 ACCOUNT NO.:  [id number]  MEDICAL RECORD NO.:  [id number]  LOCATION:  [facility]                         FACILITY:  [facility]  PHYSICIAN:  Juleen China IV, MDDATE OF BIRTH:  Feb 15, 1956  DATE OF PROCEDURE:  04/27/2011 DATE OF DISCHARGE:                              OPERATIVE REPORT   PREOPERATIVE DIAGNOSIS:  Bilateral claudication.  POSTOPERATIVE DIAGNOSIS:  Bilateral claudication.  PROCEDURE PERFORMED: 1. Ultrasound access right femoral artery. 2. Abdominal aortogram. 3. Bilateral runoff.  PROCEDURE:  The patient was identified in the holding area, taken to the room #8, placed supine on the table.  Both groins were prepped and draped in usual fashion.  Time-out was called.  The right femoral artery was evaluated with ultrasound and found to be patent and a digital ultrasound image was acquired, it was accessed under ultrasound guidance with a micropuncture needle.  Micropuncture sheath was placed over the wire followed by a Bentson wire and 5-French sheath, Omni flush catheter was advanced at the level L1, abdominal aortogram was obtained.  The cath was pulled down the aortic bifurcation.  Pelvic angiogram and bilateral runoff was performed.  FINDINGS:  Aortogram:  Visualized portions of suprarenal abdominal aorta showed no significant disease.  The right renal artery is not visualized.  The left renal artery is patent.  There is stenosis at its origin to the degree of which counter calculated from this image.  There is mild aneurysmal changes within the infrarenal abdominal aorta.  Pelvic angiogram:  Diffuse disease throughout both bilateral common iliac arteries, which were small in caliber.  Both hypogastric arteries are patent.  Both external iliac arteries are patent, but very small in caliber.  Right lower extremity:  The patch was dilatation of the right common femoral artery.  The profunda femoral artery is widely patent. Superficial  femoral artery is patent.  There are multiple ulcers throughout the superficial femoral artery.  Stenosis is identified about 50% in the popliteal artery behind the knee.  Two-vessel runoff via the anterior tibial and peroneal is visualized.  Left lower extremity:  Left common femoral artery is patent throughout its course.  However, there is approximately 50% stenosis at its distal extent.  The profunda femoral artery is patent.  The superficial femoral artery is diffusely diseased and very small in caliber.  Two-vessel runoff via the peroneal and anterior tibial artery is visualized.  IMPRESSION:  After the above images were obtained, the decision was made to terminate procedure.  The patient was felt to not be a great candidate for percutaneous revascularization given her diffuse common and external iliac disease as well as her aneurysmal changes to her aorta.     Jorge Ny, MD     VWB/MEDQ  D:  04/27/2011  T:  04/27/2011  Job:  [id number]  Electronically Signed by Arelia Longest IV MD on 06/02/2011 11:58:54 PM

## 2011-07-02 DIAGNOSIS — I739 Peripheral vascular disease, unspecified: Secondary | ICD-10-CM

## 2011-07-05 LAB — LIPID PANEL
LDL Cholesterol: 149 — ABNORMAL HIGH
Triglycerides: 90
VLDL: 18

## 2011-07-05 LAB — COMPREHENSIVE METABOLIC PANEL
ALT: 54 — ABNORMAL HIGH
AST: 48 — ABNORMAL HIGH
Albumin: 3.3 — ABNORMAL LOW
BUN: 9
CO2: 24
CO2: 25
Calcium: 9.1
Calcium: 9.4
Creatinine, Ser: 0.59
Creatinine, Ser: 0.62
GFR calc Af Amer: >60
GFR calc non Af Amer: >60
GFR calc non Af Amer: >60
Glucose, Bld: 120 — ABNORMAL HIGH

## 2011-07-05 LAB — URINALYSIS, ROUTINE W REFLEX MICROSCOPIC
Glucose, UA: NEGATIVE
Ketones, ur: NEGATIVE
Nitrite: NEGATIVE
Specific Gravity, Urine: 1.007
pH: 6.5

## 2011-07-05 LAB — BASIC METABOLIC PANEL
CO2: 23
Calcium: 8.8
GFR calc Af Amer: >60
Sodium: 141

## 2011-07-05 LAB — CBC
HCT: 40.8
HCT: 43.8
Hemoglobin: 12.9
Hemoglobin: 13.7
MCHC: 33.3
MCHC: 33.4
MCHC: 33.6
MCV: 89.6
MCV: 90.6
Platelets: 226
RBC: 4.3
RBC: 4.5
WBC: 6.5

## 2011-07-05 LAB — CARDIAC PANEL(CRET KIN+CKTOT+MB+TROPI)
CK, MB: 0.7
CK, MB: 0.8
CK, MB: 1.1
Relative Index: 1
Relative Index: INVALID
Relative Index: INVALID
Total CK: 43
Total CK: 63
Troponin I: 0.02
Troponin I: <0.01

## 2011-07-05 LAB — POCT CARDIAC MARKERS
CKMB, poc: 1.5
Myoglobin, poc: 97.9
Troponin i, poc: 0.05

## 2011-07-05 LAB — HEPATITIS PANEL, ACUTE
HCV Ab: NEGATIVE
Hep A IgM: NEGATIVE
Hep B C IgM: NEGATIVE

## 2011-07-05 LAB — GLUCOSE, CAPILLARY
Glucose-Capillary: 102 — ABNORMAL HIGH
Glucose-Capillary: 239 — ABNORMAL HIGH
Glucose-Capillary: 96

## 2011-07-05 LAB — DIFFERENTIAL
Basophils Absolute: 0
Eosinophils Relative: 3
Lymphocytes Relative: 37
Lymphs Abs: 3.3
Neutro Abs: 4.7

## 2011-07-05 LAB — CULTURE, BLOOD (ROUTINE X 2)

## 2011-07-05 LAB — D-DIMER, QUANTITATIVE: D-Dimer, Quant: 0.64 — ABNORMAL HIGH

## 2011-08-02 ENCOUNTER — Emergency Department (HOSPITAL_COMMUNITY): Payer: Medicaid Other

## 2011-08-02 ENCOUNTER — Encounter (HOSPITAL_COMMUNITY): Payer: Self-pay | Admitting: *Deleted

## 2011-08-02 ENCOUNTER — Other Ambulatory Visit: Payer: Self-pay

## 2011-08-02 ENCOUNTER — Emergency Department (HOSPITAL_COMMUNITY)
Admission: EM | Admit: 2011-08-02 | Discharge: 2011-08-02 | Disposition: A | Discharge status: Other Acute Inpatient Hospital | Payer: Medicaid Other | Source: Home / Self Care | Attending: Emergency Medicine | Admitting: Emergency Medicine

## 2011-08-02 DIAGNOSIS — I2510 Atherosclerotic heart disease of native coronary artery without angina pectoris: Principal | ICD-10-CM | POA: Diagnosis present

## 2011-08-02 DIAGNOSIS — R040 Epistaxis: Secondary | ICD-10-CM | POA: Diagnosis not present

## 2011-08-02 DIAGNOSIS — I517 Cardiomegaly: Secondary | ICD-10-CM | POA: Diagnosis present

## 2011-08-02 DIAGNOSIS — F3289 Other specified depressive episodes: Secondary | ICD-10-CM | POA: Insufficient documentation

## 2011-08-02 DIAGNOSIS — I70219 Atherosclerosis of native arteries of extremities with intermittent claudication, unspecified extremity: Secondary | ICD-10-CM | POA: Diagnosis present

## 2011-08-02 DIAGNOSIS — IMO0002 Reserved for concepts with insufficient information to code with codable children: Secondary | ICD-10-CM | POA: Diagnosis present

## 2011-08-02 DIAGNOSIS — Z9079 Acquired absence of other genital organ(s): Secondary | ICD-10-CM | POA: Insufficient documentation

## 2011-08-02 DIAGNOSIS — I200 Unstable angina: Secondary | ICD-10-CM

## 2011-08-02 DIAGNOSIS — R9431 Abnormal electrocardiogram [ECG] [EKG]: Secondary | ICD-10-CM

## 2011-08-02 DIAGNOSIS — F329 Major depressive disorder, single episode, unspecified: Secondary | ICD-10-CM | POA: Insufficient documentation

## 2011-08-02 DIAGNOSIS — M47817 Spondylosis without myelopathy or radiculopathy, lumbosacral region: Secondary | ICD-10-CM | POA: Insufficient documentation

## 2011-08-02 DIAGNOSIS — K219 Gastro-esophageal reflux disease without esophagitis: Secondary | ICD-10-CM | POA: Diagnosis present

## 2011-08-02 DIAGNOSIS — F29 Unspecified psychosis not due to a substance or known physiological condition: Secondary | ICD-10-CM

## 2011-08-02 DIAGNOSIS — F411 Generalized anxiety disorder: Secondary | ICD-10-CM | POA: Insufficient documentation

## 2011-08-02 DIAGNOSIS — F17200 Nicotine dependence, unspecified, uncomplicated: Secondary | ICD-10-CM | POA: Diagnosis present

## 2011-08-02 DIAGNOSIS — R042 Hemoptysis: Secondary | ICD-10-CM | POA: Diagnosis not present

## 2011-08-02 DIAGNOSIS — I10 Essential (primary) hypertension: Secondary | ICD-10-CM | POA: Diagnosis present

## 2011-08-02 DIAGNOSIS — I714 Abdominal aortic aneurysm, without rupture, unspecified: Secondary | ICD-10-CM | POA: Diagnosis present

## 2011-08-02 DIAGNOSIS — Z9889 Other specified postprocedural states: Secondary | ICD-10-CM | POA: Insufficient documentation

## 2011-08-02 DIAGNOSIS — D62 Acute posthemorrhagic anemia: Secondary | ICD-10-CM | POA: Diagnosis not present

## 2011-08-02 DIAGNOSIS — I708 Atherosclerosis of other arteries: Secondary | ICD-10-CM | POA: Diagnosis present

## 2011-08-02 DIAGNOSIS — M1990 Unspecified osteoarthritis, unspecified site: Secondary | ICD-10-CM | POA: Diagnosis present

## 2011-08-02 DIAGNOSIS — G8929 Other chronic pain: Secondary | ICD-10-CM | POA: Diagnosis present

## 2011-08-02 HISTORY — DX: Atherosclerotic heart disease of native coronary artery without angina pectoris: I25.10

## 2011-08-02 LAB — BASIC METABOLIC PANEL
BUN: 10 mg/dL (ref 6–23)
CO2: 23 mEq/L (ref 19–32)
Calcium: 9.1 mg/dL (ref 8.4–10.5)
Chloride: 107 mEq/L (ref 96–112)
Creatinine, Ser: 0.88 mg/dL (ref 0.50–1.10)
GFR calc Af Amer: 84 mL/min — ABNORMAL LOW (ref >90)
GFR calc non Af Amer: 72 mL/min — ABNORMAL LOW (ref >90)
Glucose, Bld: 123 mg/dL — ABNORMAL HIGH (ref 70–99)
Potassium: 3.1 mEq/L — ABNORMAL LOW (ref 3.5–5.1)
Sodium: 141 mEq/L (ref 135–145)

## 2011-08-02 LAB — CBC
HCT: 39.3 % (ref 36.0–46.0)
Hemoglobin: 12.6 g/dL (ref 12.0–15.0)
MCH: 28.3 pg (ref 26.0–34.0)
MCHC: 32.1 g/dL (ref 30.0–36.0)
MCV: 88.3 fL (ref 78.0–100.0)
Platelets: 202 10*3/uL (ref 150–400)
RBC: 4.45 MIL/uL (ref 3.87–5.11)
RDW: 16.2 % — ABNORMAL HIGH (ref 11.5–15.5)
WBC: 7.7 10*3/uL (ref 4.0–10.5)

## 2011-08-02 LAB — POCT I-STAT TROPONIN I: Troponin i, poc: 0 ng/mL (ref 0.00–0.08)

## 2011-08-02 LAB — TROPONIN I: Troponin I: <0.3 ng/mL (ref <0.30)

## 2011-08-02 MED ORDER — HEPARIN (PORCINE) IN NACL 100-0.45 UNIT/ML-% IJ SOLN
1000.0000 [IU]/kg/h | Freq: Once | INTRAMUSCULAR | Status: DC
  Filled 2011-08-02: qty 250

## 2011-08-02 MED ORDER — ASPIRIN 325 MG PO TABS
325.0000 mg | ORAL_TABLET | Freq: Once | ORAL | Status: AC
  Administered 2011-08-02: 325 mg via ORAL
  Filled 2011-08-02: qty 1

## 2011-08-02 MED ORDER — HEPARIN SODIUM (PORCINE) 1000 UNIT/ML IJ SOLN
4000.0000 [IU] | Freq: Once | INTRAMUSCULAR | Status: AC
  Administered 2011-08-02: 4000 [IU] via INTRAVENOUS

## 2011-08-02 MED ORDER — HEPARIN (PORCINE) IN NACL 100-0.45 UNIT/ML-% IJ SOLN
1000.0000 [IU]/h | Freq: Once | INTRAMUSCULAR | Status: AC
  Administered 2011-08-02: 1000 [IU]/h via INTRAVENOUS

## 2011-08-02 MED ORDER — POTASSIUM CHLORIDE CRYS ER 20 MEQ PO TBCR
40.0000 meq | EXTENDED_RELEASE_TABLET | Freq: Once | ORAL | Status: AC
  Administered 2011-08-02: 40 meq via ORAL
  Filled 2011-08-02: qty 2

## 2011-08-02 MED ORDER — MORPHINE SULFATE 4 MG/ML IJ SOLN
4.0000 mg | Freq: Once | INTRAMUSCULAR | Status: AC
  Administered 2011-08-02: 4 mg via INTRAVENOUS
  Filled 2011-08-02: qty 1

## 2011-08-02 MED ORDER — NITROGLYCERIN 2 % TD OINT
1.0000 [in_us] | TOPICAL_OINTMENT | Freq: Once | TRANSDERMAL | Status: AC
  Administered 2011-08-02: 1 [in_us] via TOPICAL
  Filled 2011-08-02: qty 1

## 2011-08-02 NOTE — ED Notes (Signed)
Family at bedside. 

## 2011-08-02 NOTE — ED Notes (Signed)
Pt resting comfortably on bed during assessment - pt states she was referred to ED by her Dr. Isidore Moos d/t hypertension - pt states x3 days she has been experiencing intermittent left-sided chest pain. Pt reports approx 3hrs ago pain began to get worse and constant describes a pressure and radiates to left arm. Pt w/o n/v/d or shortness of breath - skin warm and dry.

## 2011-08-02 NOTE — ED Notes (Signed)
Pt. Lying on carrier--alert and oriented--States she went to "mental health today" , had headache and was told her B/P was very---much higher in the right arm than the left.  Now B/P 127/61 left arm and 142/77 right arm, HR 68  R 20  Pulse Ox 96% on room air--c/o headache which she states she has had "forever"  Rates pain an 8 on 1-10 scale.

## 2011-08-02 NOTE — ED Notes (Signed)
Pt states that she was sent to ED for B/P 200/130 at mental health. Pt also c/o chest pain and left arm pain off and on x 2 days.

## 2011-08-02 NOTE — ED Notes (Signed)
Patient does not need anything at this time. Patient is ready for [facility]

## 2011-08-02 NOTE — ED Provider Notes (Signed)
Scribed for Raeford Razor, MD, the patient was seen in room APA07/APA07 . This chart was scribed by Ellie Lunch.    CSN: [id number] Arrival date & time: 08/02/2011  5:27 PM   First MD Initiated Contact with Patient 08/02/11 1813      Chief Complaint  Patient presents with  . Hypertension    (Consider location/radiation/quality/duration/timing/severity/associated sxs/prior treatment) The history is provided by the patient and medical records. No language interpreter was used.   [name] is a 55 y.o. female who presents to the Emergency Department complaining of chest pain.   Reports intermittent central chest pain for the past two days. Describes the quality of the pain: "Feels like someone is sitting on chest." Each episode last 3-4 minutes and goes away. Pt says pain has begun to radiate down left arm. Nothing induces the chest pain. Pt reports associated SOB and palpitations during episodes.  Pt also c/o HA. Denies nausea, fever, rash, chills, or edema. Pt denies any history of similar symptoms. No history of blood clots.  Pt reports that she had stress test done 05/18/2011 in preparation of an aortobifemoral bypass graft (pt has hx of iliac occlusive disease).  Aortobifemoral bypass graft was not done b/c of findings form the stress test. Pt is unsure of what the specific findings were.   Additionally Pt reports she was at her mental health Dr today and was told she has BP of 200/130 and was referred to ED. Pt denies history of hypertension. PCP Dr. Mikeal Hawthorne.   Past Medical History  Diagnosis Date  . Aortic aneurysm   . Depression   . Anxiety   . Acid reflux   . Claudication   . DJD (degenerative joint disease) of lumbar spine   . Coronary artery disease     Past Surgical History  Procedure Date  . Cholecystectomy   . Abdominal hysterectomy   . Spine surgery 2000 & 2002    lumbar laminectiomies  . Aortogram with runoff 04/27/2011    Bilateral runoff by Dr. Myra Gianotti     Family History  Problem Relation Age of Onset  . Diabetes Mother   . Diabetes Sister     History  Substance Use Topics  . Smoking status: Current Everyday Smoker -- 0.5 packs/day for 30 years    Types: Cigarettes  . Smokeless tobacco: Not on file  . Alcohol Use: No    Review of Systems 10 Systems reviewed and are negative for acute change except as noted in the HPI.  Allergies  Celebrex and Eggs or egg-derived products  Home Medications   Current Outpatient Rx  Name Route Sig Dispense Refill  . GOODY HEADACHE PO Oral Take 1 packet by mouth as needed. For pain     . DIAZEPAM 5 MG PO TABS Oral Take 5 mg by mouth 3 (three) times daily as needed. For anxiety     . OMEPRAZOLE 40 MG PO CPDR Oral Take 40 mg by mouth daily.      . SERTRALINE HCL 100 MG PO TABS Oral Take 200 mg by mouth every morning.      Marland Kitchen ALPRAZOLAM 0.5 MG PO TABS Oral Take 0.5 mg by mouth 3 (three) times daily as needed. For anxiety    . HYDROMORPHONE HCL 2 MG PO TABS Oral Take 2 mg by mouth every 4 (four) hours as needed.      Marland Kitchen TRAMADOL HCL PO Oral Take 50 mg by mouth every 6 (six) hours as needed.  BP 188/82  Pulse 79  Temp(Src) 97.1 F (36.2 C) (Oral)  Resp 16  Ht 5\' 4"  (1.626 m)  Wt 139 lb (63.05 kg)  BMI 23.86 kg/m2  SpO2 100%  Physical Exam  Nursing note and vitals reviewed. Constitutional: She is oriented to person, place, and time. She appears well-developed and well-nourished. No distress.  HENT:  Head: Normocephalic and atraumatic.  Eyes: Conjunctivae are normal. No scleral icterus.  Neck: Neck supple.  Cardiovascular: Normal rate, regular rhythm and normal heart sounds.   No murmur heard. Pulmonary/Chest: Effort normal. No respiratory distress.  Abdominal: Soft. There is no tenderness.  Musculoskeletal: Normal range of motion. She exhibits no edema.       No calf tenderness.   Neurological: She is alert and oriented to person, place, and time.  Skin: Skin is warm and dry.   Psychiatric: She has a normal mood and affect.    ED Course  Procedures (including critical care time) OTHER DATA REVIEWED: Nursing notes, vital signs, and past medical records reviewed.   DIAGNOSTIC STUDIES: Oxygen Saturation is 100% on room air, normal by my interpretation.    Results for orders placed during the hospital encounter of 08/02/11  CBC      Component Value Range   WBC 7.7  4.0 - 10.5 (K/uL)   RBC 4.45  3.87 - 5.11 (MIL/uL)   Hemoglobin 12.6  12.0 - 15.0 (g/dL)   HCT 16.1  09.6 - 04.5 (%)   MCV 88.3  78.0 - 100.0 (fL)   MCH 28.3  26.0 - 34.0 (pg)   MCHC 32.1  30.0 - 36.0 (g/dL)   RDW 40.9 (*) 81.1 - 15.5 (%)   Platelets 202  150 - 400 (K/uL)  BASIC METABOLIC PANEL      Component Value Range   Sodium 141  135 - 145 (mEq/L)   Potassium 3.1 (*) 3.5 - 5.1 (mEq/L)   Chloride 107  96 - 112 (mEq/L)   CO2 23  19 - 32 (mEq/L)   Glucose, Bld 123 (*) 70 - 99 (mg/dL)   BUN 10  6 - 23 (mg/dL)   Creatinine, Ser 9.14  0.50 - 1.10 (mg/dL)   Calcium 9.1  8.4 - 78.2 (mg/dL)   GFR calc non Af Amer 72 (*) >90 (mL/min)   GFR calc Af Amer 84 (*) >90 (mL/min)  TROPONIN I      Component Value Range   Troponin I <0.30  <0.30 (ng/mL)   Dg Chest 2 View  08/02/2011  *RADIOLOGY REPORT*  Clinical Data: Chest pain, history of valvular heart disease.  CHEST - 2 VIEW  Comparison: None.  Findings: Normal heart size, mediastinal contours, and pulmonary vascularity. Peribronchial thickening. No pulmonary infiltrate, pleural effusion or pneumothorax. No acute osseous findings. Prior cervical spine fusion.  IMPRESSION: Bronchitic changes.  Original Report Authenticated By: Lollie Marrow, M.D.   EKG:  Rhythm:normal sinus Rate: 71  Axis:Normal Intervals: normal ST segments: inferolateral ST depression, seen on previous but changed and now also includes v6 and flattening ov v3.     1. Unstable angina   2. EKG abnormalities     6:54 PM EDP discussed plan to do blood tests, xray,  review stress test.   MDM  55yF with intermittent CP concerning for unstable angina. EKG changes with ST depression/t inversion inferolaterally and change from previous. Recent nuclear stress with hypokinesis and ischemia in inferolateral distribution but not having pain then. Initial troponin negative. ASA given. Discussed with cards and  will transfer to [facility] for further eval.  I personally preformed the services scribed in my presence. The recorded information has been reviewed and considered. Raeford Razor, MD.        Raeford Razor, MD 08/02/11 2038

## 2011-08-02 NOTE — ED Notes (Signed)
Pt transported to [facility]00 via [facility] - Report given to [facility] RN - Tinnie Gens as well as May, RN on 2900. Pt reports no chest pain at present.

## 2011-08-03 ENCOUNTER — Inpatient Hospital Stay (HOSPITAL_COMMUNITY)
Admission: AD | Admit: 2011-08-03 | Discharge: 2011-08-12 | DRG: 234 | Disposition: A | Discharge status: Skilled Nursing Facility | Payer: Medicaid Other | Source: Other Acute Inpatient Hospital | Attending: Thoracic Surgery (Cardiothoracic Vascular Surgery) | Admitting: Thoracic Surgery (Cardiothoracic Vascular Surgery)

## 2011-08-03 ENCOUNTER — Encounter (HOSPITAL_COMMUNITY): Payer: Self-pay | Admitting: Radiology

## 2011-08-03 ENCOUNTER — Inpatient Hospital Stay (HOSPITAL_COMMUNITY): Payer: Medicaid Other

## 2011-08-03 DIAGNOSIS — Z720 Tobacco use: Secondary | ICD-10-CM | POA: Diagnosis present

## 2011-08-03 DIAGNOSIS — I2510 Atherosclerotic heart disease of native coronary artery without angina pectoris: Secondary | ICD-10-CM

## 2011-08-03 DIAGNOSIS — R5383 Other fatigue: Secondary | ICD-10-CM

## 2011-08-03 DIAGNOSIS — I6523 Occlusion and stenosis of bilateral carotid arteries: Secondary | ICD-10-CM | POA: Diagnosis present

## 2011-08-03 DIAGNOSIS — F419 Anxiety disorder, unspecified: Secondary | ICD-10-CM | POA: Diagnosis present

## 2011-08-03 DIAGNOSIS — Z951 Presence of aortocoronary bypass graft: Secondary | ICD-10-CM

## 2011-08-03 DIAGNOSIS — I739 Peripheral vascular disease, unspecified: Secondary | ICD-10-CM | POA: Diagnosis present

## 2011-08-03 DIAGNOSIS — I714 Abdominal aortic aneurysm, without rupture: Secondary | ICD-10-CM | POA: Diagnosis present

## 2011-08-03 DIAGNOSIS — K219 Gastro-esophageal reflux disease without esophagitis: Secondary | ICD-10-CM | POA: Diagnosis present

## 2011-08-03 DIAGNOSIS — F329 Major depressive disorder, single episode, unspecified: Secondary | ICD-10-CM | POA: Diagnosis present

## 2011-08-03 LAB — HEMOGLOBIN A1C
Hgb A1c MFr Bld: 5.7 % — ABNORMAL HIGH (ref <5.7)
Mean Plasma Glucose: 117 mg/dL — ABNORMAL HIGH (ref <117)

## 2011-08-03 LAB — TROPONIN I: Troponin I: <0.3 ng/mL (ref <0.30)

## 2011-08-03 LAB — LIPID PANEL
Cholesterol: 359 mg/dL — ABNORMAL HIGH (ref 0–200)
HDL: 27 mg/dL — ABNORMAL LOW (ref >39)
Total CHOL/HDL Ratio: 13.3 RATIO
Triglycerides: 554 mg/dL — ABNORMAL HIGH (ref <150)

## 2011-08-03 LAB — POCT I-STAT 3, ART BLOOD GAS (G3+)
Bicarbonate: 23 mEq/L (ref 20.0–24.0)
Patient temperature: 98.6
TCO2: 24 mmol/L (ref 0–100)
pH, Arterial: 7.352 (ref 7.350–7.400)
pO2, Arterial: 84 mmHg (ref 80.0–100.0)

## 2011-08-03 LAB — COMPREHENSIVE METABOLIC PANEL
Albumin: 3.2 g/dL — ABNORMAL LOW (ref 3.5–5.2)
Alkaline Phosphatase: 72 U/L (ref 39–117)
BUN: 12 mg/dL (ref 6–23)
Chloride: 108 mEq/L (ref 96–112)
Potassium: 4.1 mEq/L (ref 3.5–5.1)
Total Bilirubin: 0.1 mg/dL — ABNORMAL LOW (ref 0.3–1.2)

## 2011-08-03 LAB — CARDIAC PANEL(CRET KIN+CKTOT+MB+TROPI)
CK, MB: 2 ng/mL (ref 0.3–4.0)
Relative Index: INVALID (ref 0.0–2.5)
Relative Index: INVALID (ref 0.0–2.5)
Total CK: 47 U/L (ref 7–177)
Troponin I: <0.3 ng/mL (ref <0.30)

## 2011-08-03 LAB — MAGNESIUM: Magnesium: 2.3 mg/dL (ref 1.5–2.5)

## 2011-08-04 ENCOUNTER — Inpatient Hospital Stay (HOSPITAL_COMMUNITY): Payer: Medicaid Other

## 2011-08-04 DIAGNOSIS — I739 Peripheral vascular disease, unspecified: Secondary | ICD-10-CM

## 2011-08-04 DIAGNOSIS — I2510 Atherosclerotic heart disease of native coronary artery without angina pectoris: Secondary | ICD-10-CM

## 2011-08-04 DIAGNOSIS — Z01810 Encounter for preprocedural cardiovascular examination: Secondary | ICD-10-CM

## 2011-08-04 LAB — URINE MICROSCOPIC-ADD ON

## 2011-08-04 LAB — URINALYSIS, ROUTINE W REFLEX MICROSCOPIC
Glucose, UA: NEGATIVE mg/dL
Ketones, ur: NEGATIVE mg/dL
Nitrite: NEGATIVE
Specific Gravity, Urine: 1.013 (ref 1.005–1.030)
pH: 6 (ref 5.0–8.0)

## 2011-08-04 LAB — CBC
MCH: 28 pg (ref 26.0–34.0)
MCHC: 31.3 g/dL (ref 30.0–36.0)
MCV: 89.5 fL (ref 78.0–100.0)
Platelets: 165 10*3/uL (ref 150–400)
RDW: 16.3 % — ABNORMAL HIGH (ref 11.5–15.5)

## 2011-08-04 LAB — HEPARIN LEVEL (UNFRACTIONATED): Heparin Unfractionated: 0.29 IU/mL — ABNORMAL LOW (ref 0.30–0.70)

## 2011-08-04 NOTE — Consult Note (Signed)
NAMEKIMLEY, APSEY NO.:  [id number]  MEDICAL RECORD NO.:  [id number]  LOCATION:  2901                         FACILITY:  [facility]  PHYSICIAN:  Salvatore Decent. Cornelius Moras, M.D. DATE OF BIRTH:  1956/04/05  DATE OF CONSULTATION:  08/03/2011 DATE OF DISCHARGE:                                CONSULTATION   REQUESTING PHYSICIAN:  Italy Hilty, MD  PRIMARY CARDIOLOGIST:  Runell Gess, M.D.  PRIMARY CARE PHYSICIAN:  Lonia Blood, M.D.  REASON FOR CONSULTATION:  Severe three-vessel coronary artery disease with class III to class IV unstable angina.  HISTORY OF PRESENT ILLNESS:  The patient is a 55 year old divorced white female from [place] with no previous history of coronary artery disease, but risk factors notable for longstanding tobacco abuse and newly discovered hyperlipidemia.  The patient had recently been undergoing evaluation by Dr. Andree Coss for possible peripheral vascular disease.  The patient had complained of bilateral lower extremity pain for which she was initially evaluated by Dr. Alveda Reasons in Orthopedic Surgery.  However, symptoms seemed suspicious for claudication and the patient was referred for peripheral vascular exam. 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  Aortobifemoral bypass was recommended.  As far as her preoperative evaluation, she underwent a stress Myoview exam.  This was abnormal and notable for the presence of findings consistent with possible inferior wall ischemia.  The patient since then has developed symptoms of chest discomfort, which she describes as pressure-like pain, worse sensation as though someone is sitting on her chest.  She states that she has had off and on transient pains in her chest in the past, but these have always been very transient, mild in nature, and sporadic. However, over the last couple of days,  she has had more accelerated symptoms primarily occurring when she is up walking or doing something. Symptoms seem to improve with rest, but she has also had some mild symptoms with minimal activity.  Her blood pressure has been running high, and she ultimately presented to the emergency department where her blood pressure was notably 200/130.  Electrocardiogram was notable for sinus rhythm with ST depression in inferior and lateral leads.  Cardiac enzymes have been negative.  The patient was admitted to the hospital with presumed unstable angina and taken for elective cardiac catheterizations today by Dr. Rennis Golden.  The patient was found to have severe three-vessel coronary artery disease, with mild left ventricular dysfunction.  Cardiothoracic surgical consultation has been requested to consider coronary artery bypass surgery.  REVIEW OF SYSTEMS:  GENERAL: The patient reports no recent weight gain or weight loss.  She has decreased energy.  CARDIAC:  Notable for accelerated symptoms of chest discomfort consistent with unstable angina.  The patient has chronic exertional shortness of breath.  She denies resting shortness of breath, PND, lower extremity edema.  She has occasional palpitations.  She has never had syncope.  She complains of 1- 2 pillow orthopnea.  RESPIRATORY:  Notable for exertional shortness of breath.  The patient has a chronic nonproductive cough that is worse in the morning when she first gets up.  She continues to smoke half-pack cigarettes daily and has done so for many years.  The patient has also had some gross hemoptysis this afternoon since her heart catheterizations.  She apparently had a mild nosebleed earlier in the day, but more recently, she has coughed up some blood, small amount. She has never had this before.  She otherwise denies productive cough or wheezing.  GASTROINTESTINAL:  Negative.  The patient has no difficulty swallowing.  She reports normal bowel  function.  She denies hematochezia, hematemesis, melena.  GENITOURINARY:  Negative.  The patient denies urinary urgency or frequency.  She has been told that she has a nonfunctioning kidney on one side and she worries about this. MUSCULOSKELETAL:  Notable for chronic pain in both lower extremities that begins in the buttocks and goes down through the thighs and calves. This occurs in both lower extremities and it typically occurs with ambulation and is relieved by rest fairly promptly.  She does report some intermittent symptoms of similar discomfort at rest.  The patient also has some chronic back pain related to degenerative disk disease. NEUROLOGIC:  Negative.  The patient denies symptoms suggestive of previous TIA or stroke.  HEENT:  Negative.  PSYCHIATRIC:  Notable for long history of anxiety and depression.  ENDOCRINE: The patient is unaware of the fact that she has hyperlipidemia.  She does not believe that she has diabetes.  PAST MEDICAL HISTORY: 1. Hypertension. 2. Peripheral vascular disease. 3. Depression. 4. Anxiety. 5. GE reflux disease. 6. Degenerative joint disease.  PAST SURGICAL HISTORY: 1. Hysterectomy. 2. Cholecystectomy. 3. Lower back surgery x2.  FAMILY HISTORY:  Notable for presence of diabetes mellitus.  There is no history of premature coronary artery disease.  SOCIAL HISTORY:  The patient is divorced and lives with her boyfriend. She has 3 grown children and numerous grandchildren.  She smokes half- pack cigarettes daily and has done so for more than 30 years.  She does not use alcohol.  She previously worked at [employer].  She has been out of work since June.  MEDICATIONS PRIOR TO ADMISSION: 1. Zoloft 200 mg daily. 2. Prilosec 40 mg daily. 3. Valium 5 mg 3 times daily. 4. Goody's Powder as needed.  DRUG ALLERGIES:  CELEBREX.  PHYSICAL EXAMINATION:  GENERAL:  The patient is chronically ill- appearing female who appears  considerably older than her stated age, but is in no acute distress. VITAL SIGNS:  She is in normal sinus rhythm.  Blood pressure most recently measured 142/66.  Oxygen saturation is 97% on 2 L of nasal cannula. HEENT:  Unrevealing. NECK:  There is no palpable lymphadenopathy.  There are soft carotid bruits. LUNGS:  Auscultation of the chest reveals clear breath sounds anteriorly.  No wheezes, rales, or rhonchi are noted. CARDIOVASCULAR:  Notable for regular rate and rhythm.  No murmurs, rubs, or gallops are appreciated. ABDOMEN:  Soft, nondistended, nontender.  Bowel sounds are present. There are no palpable masses. EXTREMITIES:  Warm and adequately perfused.  There is no lower extremity edema.  Distal pulses are not palpable at the ankle.  There is no sign of venous insufficiency. SKIN:  Thin and delicate, but otherwise, appears healthy and intact. RECTAL AND GU:  Both deferred. NEUROLOGIC:  Grossly nonfocal and symmetrical throughout.  DIAGNOSTIC TESTS:  Cardiac catheterization performed by Dr. Rennis Golden earlier today is reviewed.  This demonstrates severe three-vessel coronary artery disease with mild left ventricular dysfunction. Specifically, there is long segment 80% tubular proximal stenosis of  left anterior descending coronary artery.  The distal left anterior descending coronary artery gives rise to a very large diagonal branch that wraps back of the heart and perfuses the right coronary circulation via left-to-right collaterals.  There is distal occlusion of the terminal portion of the left anterior descending coronary artery, which actually is fairly small vessel.  There is 90-95% discrete proximal stenosis of the left circumflex coronary artery before it gives rise to a single, large, dominant obtuse marginal branch.  There is 100% chronic occlusion of the proximal right coronary artery with left-to-right collateral filling of the posterior descending coronary artery and  a posterolateral branch.  Left ventricular function is mildly reduced with mild inferior wall hypokinesis.  Ejection fraction is estimated 50-55%.  IMPRESSION:  Severe three-vessel coronary artery disease with mild left ventricular dysfunction.  The patient presents with symptoms consistent with unstable angina.  I agree that she would best be treated with surgical revascularization.  PLAN:  I have outlined options at length with Ms. Galloway here in her hospital room this evening.  Alternative treatment strategies have been discussed in detail.  The risks associated with coronary artery bypass grafting had been discussed in detail.  The patient understands and accepts all potential associated risks of surgery including but not limited to risk of death, stroke, myocardial infarction, congestive heart failure, respiratory failure, renal failure, bleeding requiring blood transfusion, arrhythmia, infection, late recurrence of coronary artery disease.  We have discussed how important it will be for her to quit smoking completely.  All of her questions have been addressed.  We tentatively plan to proceed with surgery on Thursday, November 1.  We will obtain a chest CT scan because of the development of hemoptysis, although I feel that her hemoptysis may simply be related to the fact that she had a nosebleed earlier in the day.     Salvatore Decent. Cornelius Moras, M.D.     CHO/MEDQ  D:  08/03/2011  T:  08/04/2011  Job:  [id number]  cc:   Italy Hilty, MD Runell Gess Lonia Blood, M.D.  Electronically Signed by Tressie Stalker M.D. on 08/04/2011 09:50:34 AM

## 2011-08-05 ENCOUNTER — Inpatient Hospital Stay (HOSPITAL_COMMUNITY): Payer: Medicaid Other

## 2011-08-05 DIAGNOSIS — Z951 Presence of aortocoronary bypass graft: Secondary | ICD-10-CM

## 2011-08-05 DIAGNOSIS — I6529 Occlusion and stenosis of unspecified carotid artery: Secondary | ICD-10-CM

## 2011-08-05 DIAGNOSIS — I219 Acute myocardial infarction, unspecified: Secondary | ICD-10-CM

## 2011-08-05 DIAGNOSIS — I6523 Occlusion and stenosis of bilateral carotid arteries: Secondary | ICD-10-CM | POA: Diagnosis present

## 2011-08-05 HISTORY — DX: Acute myocardial infarction, unspecified: I21.9

## 2011-08-05 HISTORY — PX: CORONARY ARTERY BYPASS GRAFT: SHX141

## 2011-08-05 LAB — POCT I-STAT 4, (NA,K, GLUC, HGB,HCT)
Glucose, Bld: 89 mg/dL (ref 70–99)
Glucose, Bld: 90 mg/dL (ref 70–99)
HCT: 20 % — ABNORMAL LOW (ref 36.0–46.0)
HCT: 26 % — ABNORMAL LOW (ref 36.0–46.0)
Hemoglobin: 10.2 g/dL — ABNORMAL LOW (ref 12.0–15.0)
Hemoglobin: 8.8 g/dL — ABNORMAL LOW (ref 12.0–15.0)
Hemoglobin: 10.2 g/dL — ABNORMAL LOW (ref 12.0–15.0)
Potassium: 3.9 mEq/L (ref 3.5–5.1)
Potassium: 5.1 mEq/L (ref 3.5–5.1)
Potassium: 3.2 mEq/L — ABNORMAL LOW (ref 3.5–5.1)
Sodium: 137 mEq/L (ref 135–145)
Sodium: 144 mEq/L (ref 135–145)

## 2011-08-05 LAB — POCT I-STAT 3, ART BLOOD GAS (G3+)
Bicarbonate: 22.2 mEq/L (ref 20.0–24.0)
Bicarbonate: 25.3 mEq/L — ABNORMAL HIGH (ref 20.0–24.0)
Bicarbonate: 21.8 mEq/L (ref 20.0–24.0)
Patient temperature: 38
TCO2: 26 mmol/L (ref 0–100)
TCO2: 23 mmol/L (ref 0–100)
pCO2 arterial: 30.4 mmHg — ABNORMAL LOW (ref 35.0–45.0)
pCO2 arterial: 32.9 mmHg — ABNORMAL LOW (ref 35.0–45.0)
pCO2 arterial: 40.7 mmHg (ref 35.0–45.0)
pH, Arterial: 7.473 — ABNORMAL HIGH (ref 7.350–7.400)
pH, Arterial: 7.489 — ABNORMAL HIGH (ref 7.350–7.400)
pH, Arterial: 7.341 — ABNORMAL LOW (ref 7.350–7.400)
pO2, Arterial: 357 mmHg — ABNORMAL HIGH (ref 80.0–100.0)
pO2, Arterial: 105 mmHg — ABNORMAL HIGH (ref 80.0–100.0)

## 2011-08-05 LAB — COMPREHENSIVE METABOLIC PANEL
ALT: 5 U/L (ref 0–35)
AST: 11 U/L (ref 0–37)
CO2: 25 mEq/L (ref 19–32)
Calcium: 9.2 mg/dL (ref 8.4–10.5)
Chloride: 106 mEq/L (ref 96–112)
Creatinine, Ser: 0.88 mg/dL (ref 0.50–1.10)
GFR calc Af Amer: 84 mL/min — ABNORMAL LOW (ref >90)
GFR calc non Af Amer: 72 mL/min — ABNORMAL LOW (ref >90)
Glucose, Bld: 91 mg/dL (ref 70–99)
Total Bilirubin: 0.1 mg/dL — ABNORMAL LOW (ref 0.3–1.2)

## 2011-08-05 LAB — CBC
HCT: 36.4 % (ref 36.0–46.0)
Hemoglobin: 11.4 g/dL — ABNORMAL LOW (ref 12.0–15.0)
Hemoglobin: 9.2 g/dL — ABNORMAL LOW (ref 12.0–15.0)
MCH: 28.2 pg (ref 26.0–34.0)
MCHC: 31.3 g/dL (ref 30.0–36.0)
MCHC: 33.3 g/dL (ref 30.0–36.0)
RBC: 4.04 MIL/uL (ref 3.87–5.11)
RBC: 3.21 MIL/uL — ABNORMAL LOW (ref 3.87–5.11)

## 2011-08-05 LAB — HEMOGLOBIN AND HEMATOCRIT, BLOOD: HCT: 27.5 % — ABNORMAL LOW (ref 36.0–46.0)

## 2011-08-05 LAB — HEPARIN LEVEL (UNFRACTIONATED): Heparin Unfractionated: 0.68 IU/mL (ref 0.30–0.70)

## 2011-08-05 NOTE — Cardiovascular Report (Signed)
NAMEALIANNA, [name] NO.:  [id number]  MEDICAL RECORD NO.:  [id number]  LOCATION:  2901                         FACILITY:  [facility]  PHYSICIAN:  Denise Vallie Teters, MD         DATE OF BIRTH:  Feb 20, 1956  DATE OF PROCEDURE:  08/03/2011 DATE OF DISCHARGE:                           CARDIAC CATHETERIZATION   OPERATOR:  Denise Emelina Hinch, MD  HISTORY OF PRESENT ILLNESS:  [name] is a 55 year old female with severe long-standing history of smoking, multiple coronary risk factors, she recently was found to have an abnormal nuclear stress test, has been having persistent low-grade chest pain.  She also has known peripheral arterial disease with bilateral severe occlusive disease, and was originally scheduled for a aortobifemoral bypass, however, underwent stress testing and the procedure was canceled.  She was admitted with chest pain, is now referred for heart cardiac catheterizations.  After informed consent was obtained, the patient brought to cardiac catheterization lab.  Sterilely prepped and draped in usual fashion. After radiation and procedural safety time-out, the patient was given a mg of Versed and 25 mcg of fentanyl for moderate sedation.  The area around the right radial artery was identified due to significant lower extremity PAD, was felt that this would be a safer approach to image her coronaries.  She was anesthetized locally with 5 mL of 1% lidocaine, and a 5-French right radial sheath was placed with a straight needle and wire.  After placement was assured, 3000 units of IV heparin was given and a total of 10 mL of radial cocktail was given to prevent spasm. Cardiac catheterization was performed with a 5-French TIG 4.0 catheter as well as a JR4 catheter and pigtail catheters. Estimated blood loss less than 5 mL and no acute complications.  A TR band was placed with 12 mL of air and hemostasis was achieved at the end of the procedure.  FINDINGS: 1. Left  main - no disease. 2. LAD - proximal tubular 85% stenosis, and mild to moderate disease     throughout tortuous vessel.  The distal LAD does give collaterals     to the RCA and arrest of decent caliber as a bypass target. 3. Left circumflex - 95% discrete proximal left circumflex stenosis.     Again, the distal left circumflex is likely good bypass target. 4. Right coronary artery - is a dominant vessel.  It is 100%     proximally occluded.  There are left to right collaterals from the     LAD. 5. LVEDP - 11 mmHg. 6. LV gram - EF 50-55%, with mild inferior hypokinesis.  IMPRESSION: 1. Three-vessel coronary artery disease with a good bypass targets.     Positive outpatient nuclear stress test with ongoing chest pain. 2. Left ventriculogram shows a well-preserved left ventricular     systolic function.  PLAN:  Discussed the case with Dr. Clarene Alvarado, however, it is fairly clear that the patient would benefit from bypass surgery.  I would like to have CT Surgery evaluate her and plan repair prior to addressing her significant peripheral arterial disease.   Denise Freedom Peddy, MD     CH/MEDQ  D:  08/03/2011  T:  08/04/2011  Job:  [id number]  Electronically Signed by [name] M.D. on 08/05/2011 04:54:09 AM

## 2011-08-06 ENCOUNTER — Other Ambulatory Visit: Payer: Self-pay

## 2011-08-06 ENCOUNTER — Inpatient Hospital Stay (HOSPITAL_COMMUNITY): Payer: Medicaid Other

## 2011-08-06 DIAGNOSIS — I2510 Atherosclerotic heart disease of native coronary artery without angina pectoris: Secondary | ICD-10-CM

## 2011-08-06 LAB — CBC
HCT: 26.9 % — ABNORMAL LOW (ref 36.0–46.0)
MCH: 28.6 pg (ref 26.0–34.0)
MCH: 28.7 pg (ref 26.0–34.0)
MCHC: 33.1 g/dL (ref 30.0–36.0)
MCHC: 32.7 g/dL (ref 30.0–36.0)
MCV: 86.5 fL (ref 78.0–100.0)
MCV: 87.6 fL (ref 78.0–100.0)
Platelets: 152 10*3/uL (ref 150–400)
Platelets: 149 10*3/uL — ABNORMAL LOW (ref 150–400)
RDW: 15.7 % — ABNORMAL HIGH (ref 11.5–15.5)
RDW: 15.9 % — ABNORMAL HIGH (ref 11.5–15.5)
WBC: 12.4 10*3/uL — ABNORMAL HIGH (ref 4.0–10.5)

## 2011-08-06 LAB — BASIC METABOLIC PANEL
BUN: 8 mg/dL (ref 6–23)
Calcium: 7.5 mg/dL — ABNORMAL LOW (ref 8.4–10.5)
Creatinine, Ser: 0.64 mg/dL (ref 0.50–1.10)
GFR calc Af Amer: >90 mL/min (ref >90)

## 2011-08-06 LAB — PREPARE PLATELET PHERESIS: Unit division: 0

## 2011-08-06 LAB — PREPARE FRESH FROZEN PLASMA: Unit division: 0

## 2011-08-06 LAB — GLUCOSE, CAPILLARY
Glucose-Capillary: 101 mg/dL — ABNORMAL HIGH (ref 70–99)
Glucose-Capillary: 113 mg/dL — ABNORMAL HIGH (ref 70–99)
Glucose-Capillary: 140 mg/dL — ABNORMAL HIGH (ref 70–99)
Glucose-Capillary: 151 mg/dL — ABNORMAL HIGH (ref 70–99)
Glucose-Capillary: 97 mg/dL (ref 70–99)
Glucose-Capillary: 128 mg/dL — ABNORMAL HIGH (ref 70–99)
Glucose-Capillary: 132 mg/dL — ABNORMAL HIGH (ref 70–99)

## 2011-08-06 LAB — MAGNESIUM
Magnesium: 2.9 mg/dL — ABNORMAL HIGH (ref 1.5–2.5)
Magnesium: 2.4 mg/dL (ref 1.5–2.5)

## 2011-08-06 NOTE — Op Note (Signed)
[name], RAVERT NO.:  [id number]  MEDICAL RECORD NO.:  [id number]  LOCATION:  2303                         FACILITY:  [facility]  PHYSICIAN:  Salvatore Decent. Cornelius Moras, M.D. DATE OF BIRTH:  26-Jan-1956  DATE OF PROCEDURE:  08/05/2011 DATE OF DISCHARGE:                              OPERATIVE REPORT   PREOPERATIVE DIAGNOSIS:  Severe three-vessel coronary artery disease.  POSTOPERATIVE DIAGNOSIS:  Severe three-vessel coronary artery disease.  PROCEDURE:  Median sternotomy for coronary artery bypass grafting x3 (left internal mammary artery to diagonal branch of the distal left anterior descending coronary artery, saphenous vein graft to obtuse marginal branch of the left circumflex coronary artery, saphenous vein graft to posterior descending coronary artery, open saphenous vein harvest from bilateral thighs).  SURGEON:  Salvatore Decent. Cornelius Moras, MD.  ASSISTANT:  Mr. Al Corpus, CSFA.  ANESTHESIOLOGIST:  Bedelia Person, MD.  BRIEF CLINICAL INDICATION:  The patient is a 55 year old female with no previous history of coronary artery disease, but risk factors notable for long-standing tobacco abuse, hyperlipidemia, and peripheral vascular disease.  The patient was referred for a stress test in preparation for aortobifemoral bypass surgery for severe aortoiliac occlusive disease. Stress test was abnormal.  The patient later developed severe hypertension and accelerating symptoms of chest pain consistent with angina pectoris.  She was admitted to the hospital for unstable angina and ruled out for acute myocardial infarction.  Cardiac catheterization demonstrates severe three-vessel coronary artery disease with normal left ventricular function.  A full consultation note has been dictated previously.  The patient has been counseled regarding the indications, risks, and potential benefits of surgery.  Alternative treatment strategies have been discussed.  The patient understands and  accepts all potential-associated risks and desires to proceed with surgery as described.  FINDINGS: 1. Normal left ventricular function. 2. Mild aortic insufficiency. 3. Trace mitral regurgitation. 4. Good quality left internal mammary artery conduit for grafting. 5. Very small, poor-quality saphenous vein conduit for grafting     requiring open saphenous vein harvest from bilateral thighs.  OPERATIVE PROCEDURE IN DETAIL:  The patient was brought to the operating room on the above-mentioned date and central monitoring was established by the Anesthesia team under the care and direction of Dr. Bedelia Person. Specifically, a Swan-Ganz catheter was placed through the right internal jugular approach.  A radial arterial line was placed.  Intravenous antibiotics were administered.  The patient was placed in the supine position on the operating table.  General endotracheal anesthesia was induced uneventfully.  A Foley catheter was placed.  The patient's chest, abdomen, both groins, and both lower extremities were prepared and draped in sterile manner.  Baseline transesophageal echocardiogram was performed by Dr. Gypsy Balsam.  This demonstrated mild aortic insufficiency and trace mitral regurgitation.  No other significant abnormalities are noted.  A median sternotomy incision was performed and the left internal mammary artery was dissected from the chest wall and prepared for bypass grafting.  The left internal mammary artery was slightly small caliber, but good quality conduit.  Simultaneously, both the right and left thighs were explored for possible endoscopic vein harvest.  However, both just above and just below the knee greater saphenous  vein could not be identified and the only vein in this region are very tiny and clearly not suitable as a conduit for grafting.  Subsequently, an incision was made in the left thigh just below the femoral triangle.  The greater saphenous vein was identified  where joints of saphenous bulb to enter femoral vein.  The greater saphenous vein was removed using open vein harvest technique from the left thigh.  Approximately midway down the thigh, the vein got too small to be utilized for grafting.  An additional segment of saphenous vein was then obtained from the patient's right thigh using open vein harvest technique, also beginning at the groin.  Similarly to the left, just below midportion of the thigh, the greater saphenous vein got too small to be utilized as a graft.  Care was taken on both sides to explore to make sure that there was not a bifurcated system, but no other saphenous vein branches were identified.  The continuation of the saphenous vein beyond is simply too small to be utilized as conduit.  The patient was heparinized systemically.  The left internal mammary artery was transected distally and noted to have good flow.  The pericardium was opened.  The ascending aorta was mildly sclerotic, but otherwise normal in appearance.  The ascending aorta and the right atrium were cannulated for cardiopulmonary bypass.  Cardiopulmonary bypass was begun and the surface of the heart was inspected.  Distal target vessels were selected for coronary artery bypass grafting.  A temperature probe was placed in the left ventricular septum and a cardioplegic cannula was placed in the ascending aorta.  The patient was allowed to cool passively to 32 degrees systemic temperature.  The aortic cross-clamp was applied and cold blood cardioplegia was delivered in the antegrade fashion through the aortic root.  Iced saline flush was applied for topical hypothermia.  The initial cardioplegic arrest was rapid with early diastolic arrest. Repeat doses of cardioplegia were administered intermittently throughout the crossclamp portion of the operation through the aortic root and down the subsequently placed vein graft to maintain completely  flat electrocardiogram and left ventricular septal myocardial temperature below 15 degrees centigrade.  The following distal coronary anastomoses were performed: 1. The posterior descending coronary artery was grafted with a     saphenous vein graft in an end-to-side fashion.  This vessel     measured 1.4 mm in diameter and was a fair-to-good quality target     vessel at the site of distal grafting. 2. The obtuse marginal branch of the left circumflex coronary artery     was grafted with a saphenous vein graft in an end-to-side fashion.     This vessel measured 1.3 mm in diameter and was a fair-to-good     quality target vessel for grafting. 3. The large diagonal branch off the distal left anterior descending     coronary artery was grafted with a left internal mammary artery in     an end-to-side fashion.  This vessel was the dominant branch     supplying the anterior wall as the terminal portion of the left     anterior descending coronary artery was quite small.  The diagonal     branch measured 1.6 mm in diameter at the site of distal grafting     and was a good-quality target vessel.  Both proximal saphenous vein anastomoses were performed directly to the ascending aorta prior to removal of the aortic crossclamp.  The left ventricular septal temperature  rose appropriately with reperfusion of the left internal mammary artery graft.  The aortic crossclamp was removed after a total crossclamp time of 62 minutes.  The heart began to beat spontaneously without need for cardioversion.  All proximal and distal coronary anastomoses were inspected for hemostasis and appropriate graft orientation.  Epicardial pacing wires were fixed to the right ventricular free wall into the right atrial appendage.  The patient was rewarmed to 37 degrees centigrade temperature.  The patient was weaned from cardiopulmonary bypass without difficulty.  The patient's rhythm at separation from her bypass was  normal sinus rhythm. Total cardiopulmonary bypass time for the operation was 80 minutes.  No inotropic support was required.  Followup transesophageal echocardiogram performed by Dr. Gypsy Balsam after separation from bypass demonstrated normal left ventricular function.  Venous and arterial cannulae were removed uneventfully.  Protamine was administered to reverse the anticoagulation.  The mediastinum and the left pleural space were irrigated with saline solution and inspected for hemostasis.  The mediastinum and both left and right pleural spaces were drained with 4 chest tubes placed through separate stab incisions inferiorly.  The pericardium and soft tissues anterior to the aorta were reapproximated loosely.  The sternum was closed using double-strength sternal wire.  The soft tissues anterior to the sternum were closed in multiple layers and the skin was closed with a running subcuticular skin closure.  The patient tolerated the procedure well and was transported to the surgical intensive care unit in stable condition.  There were no intraoperative complications.  All sponge, instrument, and needle counts were verified correct at the completion of the operation.     Salvatore Decent. Cornelius Moras, M.D.     CHO/MEDQ  D:  08/05/2011  T:  08/06/2011  Job:  [id number]  cc:   Italy Hilty, MD Nanetta Batty, M.D. Lonia Blood, M.D.  Electronically Signed by Tressie Stalker M.D. on 08/06/2011 08:09:16 AM

## 2011-08-07 ENCOUNTER — Inpatient Hospital Stay (HOSPITAL_COMMUNITY): Payer: Medicaid Other

## 2011-08-07 LAB — BASIC METABOLIC PANEL
BUN: 10 mg/dL (ref 6–23)
CO2: 28 mEq/L (ref 19–32)
Chloride: 101 mEq/L (ref 96–112)
Glucose, Bld: 118 mg/dL — ABNORMAL HIGH (ref 70–99)
Potassium: 3.6 mEq/L (ref 3.5–5.1)

## 2011-08-07 LAB — TYPE AND SCREEN
DAT, IgG: NEGATIVE
Unit division: 0
Unit division: 0

## 2011-08-07 LAB — CBC
MCHC: 32.7 g/dL (ref 30.0–36.0)
Platelets: 162 10*3/uL (ref 150–400)
RDW: 15.8 % — ABNORMAL HIGH (ref 11.5–15.5)
WBC: 12.7 10*3/uL — ABNORMAL HIGH (ref 4.0–10.5)

## 2011-08-07 LAB — GLUCOSE, CAPILLARY
Glucose-Capillary: 123 mg/dL — ABNORMAL HIGH (ref 70–99)
Glucose-Capillary: 127 mg/dL — ABNORMAL HIGH (ref 70–99)

## 2011-08-07 MED ORDER — ONDANSETRON HCL 4 MG/2ML IJ SOLN: 4.0000 mg | Freq: Four times a day (QID) | INTRAMUSCULAR | Status: DC | PRN

## 2011-08-07 MED ORDER — SODIUM CHLORIDE 0.9 % IJ SOLN
3.0000 mL | Freq: Two times a day (BID) | INTRAMUSCULAR | Status: DC
  Administered 2011-08-07 – 2011-08-12 (×10): 3 mL via INTRAVENOUS

## 2011-08-07 MED ORDER — BISACODYL 10 MG RE SUPP: 10.0000 mg | Freq: Every day | RECTAL | Status: DC | PRN

## 2011-08-07 MED ORDER — SERTRALINE HCL 100 MG PO TABS
200.0000 mg | ORAL_TABLET | Freq: Every day | ORAL | Status: DC
  Administered 2011-08-08 – 2011-08-09 (×2): 200 mg via ORAL
  Filled 2011-08-07 (×3): qty 2

## 2011-08-07 MED ORDER — MAGNESIUM HYDROXIDE 400 MG/5ML PO SUSP
30.0000 mL | Freq: Every day | ORAL | Status: DC | PRN
Start: 2011-08-07 — End: 2011-08-12

## 2011-08-07 MED ORDER — BISACODYL 5 MG PO TBEC: 10.0000 mg | DELAYED_RELEASE_TABLET | Freq: Every day | ORAL | Status: DC | PRN

## 2011-08-07 MED ORDER — ACETAMINOPHEN 500 MG PO TABS
500.0000 mg | ORAL_TABLET | Freq: Four times a day (QID) | ORAL | Status: AC
  Administered 2011-08-08 – 2011-08-10 (×10): 500 mg via ORAL
  Filled 2011-08-07 (×5): qty 1

## 2011-08-07 MED ORDER — ACETAMINOPHEN 325 MG PO TABS: 650.0000 mg | ORAL_TABLET | Freq: Four times a day (QID) | ORAL | Status: DC | PRN

## 2011-08-07 MED ORDER — TRAMADOL HCL 50 MG PO TABS
50.0000 mg | ORAL_TABLET | ORAL | Status: DC | PRN
  Administered 2011-08-09: 100 mg via ORAL

## 2011-08-07 MED ORDER — PANTOPRAZOLE SODIUM 40 MG PO TBEC
40.0000 mg | DELAYED_RELEASE_TABLET | Freq: Every day | ORAL | Status: DC
  Administered 2011-08-08 – 2011-08-12 (×5): 40 mg via ORAL
  Filled 2011-08-07 (×4): qty 1

## 2011-08-07 MED ORDER — POTASSIUM CHLORIDE 20 MEQ PO PACK
20.0000 meq | PACK | Freq: Every day | ORAL | Status: AC
  Filled 2011-08-07 (×2): qty 1

## 2011-08-07 MED ORDER — LISINOPRIL 5 MG PO TABS
5.0000 mg | ORAL_TABLET | Freq: Every day | ORAL | Status: DC
  Filled 2011-08-07 (×2): qty 1

## 2011-08-07 MED ORDER — ROSUVASTATIN CALCIUM 20 MG PO TABS
20.0000 mg | ORAL_TABLET | Freq: Every day | ORAL | Status: DC
  Administered 2011-08-08 – 2011-08-11 (×4): 20 mg via ORAL
  Filled 2011-08-07 (×6): qty 1

## 2011-08-07 MED ORDER — FENOFIBRATE 160 MG PO TABS
160.0000 mg | ORAL_TABLET | Freq: Every day | ORAL | Status: DC
  Administered 2011-08-08 – 2011-08-12 (×5): 160 mg via ORAL
  Filled 2011-08-07 (×8): qty 1

## 2011-08-07 MED ORDER — METOPROLOL TARTRATE 12.5 MG HALF TABLET
12.5000 mg | ORAL_TABLET | Freq: Two times a day (BID) | ORAL | Status: DC
  Administered 2011-08-07 – 2011-08-12 (×10): 12.5 mg via ORAL
  Filled 2011-08-07 (×2): qty 1
  Filled 2011-08-07 (×2): qty 0.5
  Filled 2011-08-07 (×2): qty 1
  Filled 2011-08-07 (×2): qty 0.5
  Filled 2011-08-07 (×2): qty 1
  Filled 2011-08-07 (×3): qty 0.5

## 2011-08-07 MED ORDER — ONDANSETRON HCL 4 MG PO TABS: 4.0000 mg | ORAL_TABLET | Freq: Four times a day (QID) | ORAL | Status: DC | PRN

## 2011-08-07 MED ORDER — INSULIN ASPART 100 UNIT/ML ~~LOC~~ SOLN
0.0000 [IU] | Freq: Three times a day (TID) | SUBCUTANEOUS | Status: DC
  Filled 2011-08-07: qty 3

## 2011-08-07 MED ORDER — DOCUSATE SODIUM 100 MG PO CAPS
200.0000 mg | ORAL_CAPSULE | Freq: Every day | ORAL | Status: DC
  Administered 2011-08-08: 200 mg via ORAL
  Filled 2011-08-07 (×3): qty 2

## 2011-08-07 MED ORDER — OXYCODONE HCL 5 MG PO TABS: 5.0000 mg | ORAL_TABLET | ORAL | Status: DC | PRN

## 2011-08-07 MED ORDER — ALPRAZOLAM 0.25 MG PO TABS: 0.2500 mg | ORAL_TABLET | Freq: Four times a day (QID) | ORAL | Status: DC | PRN

## 2011-08-07 MED ORDER — ALUM & MAG HYDROXIDE-SIMETH 200-200-20 MG/5ML PO SUSP: 15.0000 mL | ORAL | Status: DC | PRN

## 2011-08-07 MED ORDER — FUROSEMIDE 40 MG PO TABS
40.0000 mg | ORAL_TABLET | Freq: Every day | ORAL | Status: AC
  Administered 2011-08-08: 40 mg via ORAL
  Filled 2011-08-07 (×2): qty 1

## 2011-08-07 MED ORDER — CHLORHEXIDINE GLUCONATE CLOTH 2 % EX PADS
6.0000 | MEDICATED_PAD | Freq: Every day | CUTANEOUS | Status: AC
  Administered 2011-08-09: 6 via TOPICAL
  Filled 2011-08-07 (×4): qty 6

## 2011-08-07 MED ORDER — ASPIRIN EC 325 MG PO TBEC
325.0000 mg | DELAYED_RELEASE_TABLET | Freq: Every day | ORAL | Status: DC
  Administered 2011-08-08 – 2011-08-12 (×5): 325 mg via ORAL
  Filled 2011-08-07 (×6): qty 1

## 2011-08-08 DIAGNOSIS — I739 Peripheral vascular disease, unspecified: Secondary | ICD-10-CM | POA: Diagnosis present

## 2011-08-08 DIAGNOSIS — F419 Anxiety disorder, unspecified: Secondary | ICD-10-CM | POA: Diagnosis present

## 2011-08-08 DIAGNOSIS — I2510 Atherosclerotic heart disease of native coronary artery without angina pectoris: Secondary | ICD-10-CM

## 2011-08-08 DIAGNOSIS — I714 Abdominal aortic aneurysm, without rupture: Secondary | ICD-10-CM | POA: Diagnosis present

## 2011-08-08 DIAGNOSIS — K219 Gastro-esophageal reflux disease without esophagitis: Secondary | ICD-10-CM | POA: Diagnosis present

## 2011-08-08 DIAGNOSIS — R5383 Other fatigue: Secondary | ICD-10-CM

## 2011-08-08 DIAGNOSIS — Z720 Tobacco use: Secondary | ICD-10-CM | POA: Diagnosis present

## 2011-08-08 DIAGNOSIS — F329 Major depressive disorder, single episode, unspecified: Secondary | ICD-10-CM | POA: Diagnosis present

## 2011-08-08 MED ORDER — OXYCODONE HCL 5 MG PO TABS: 5.0000 mg | ORAL_TABLET | ORAL | Status: DC | PRN

## 2011-08-08 MED ORDER — POLYSACCHARIDE IRON 150 MG PO CAPS
150.0000 mg | ORAL_CAPSULE | Freq: Every day | ORAL | Status: DC
  Administered 2011-08-08 – 2011-08-12 (×5): 150 mg via ORAL
  Filled 2011-08-08 (×5): qty 1

## 2011-08-08 MED ORDER — LISINOPRIL 10 MG PO TABS
10.0000 mg | ORAL_TABLET | Freq: Every day | ORAL | Status: DC
  Administered 2011-08-08 – 2011-08-12 (×5): 10 mg via ORAL
  Filled 2011-08-08 (×5): qty 1

## 2011-08-08 MED ORDER — POTASSIUM CHLORIDE CRYS ER 20 MEQ PO TBCR
EXTENDED_RELEASE_TABLET | ORAL | Status: AC
  Administered 2011-08-08: 20 meq
  Filled 2011-08-08: qty 1

## 2011-08-08 NOTE — Progress Notes (Addendum)
  Subjective: Patient feeling a little better than yesterday.  Still with some incisional pain.  Objective: Vital signs in last 24 hours: Temp:  [98.8 F (37.1 C)] 98.8 F (37.1 C) (11/04 0637) Pulse Rate:  [81] 81  (11/04 0637) Cardiac Rhythm:  [-]  Resp:  [18] 18  (11/04 0637) BP: (144)/(58) 144/58 mmHg (11/04 0637) SpO2:  [95 %] 95 % (11/04 0637) Weight:  [138 lb 14.4 oz (63.005 kg)] 138 lb 14.4 oz (63.005 kg) (11/04 1610)  Hemodynamic parameters for last 24 hours:    Intake/Output from previous day: 11/03 0701 - 11/04 0700 In: 240 [P.O.:240] Out: -     PHYSICAL EXAM:  CV: RRR, No murmur, gallop, or rub. Pulmonary: Slighlty decreased at the bases.  No rales, wheezes, or rhonchi. Abdomen:  Soft, non tender, non distended, bowel sounds present. Extremities:  Mild LE edema bilaterally.  Wounds: Left distal thigh wound with some bloody ooze.  Remaining thigh incisions clean and dry.  No erythema or signs of infection.  Sternal wound is clean and dry.    Lab Results:  [facility] 08/07/11 0946 08/06/11 1740  WBC 12.7* 12.4*  HGB 8.3* 9.0*  HCT 25.4* 27.5*  PLT 162 149*   BMET:  Basename 08/07/11 0946 08/06/11 1740 08/06/11 0325  NA 137 -- 140  K 3.6 -- 3.9  CL 101 -- 107  CO2 28 -- 26  GLUCOSE 118* -- 136*  BUN 10 -- 8  CREATININE 0.74 0.76 --  CALCIUM 8.7 -- 7.5*    PT/INR:  Basename 08/05/11 1730  LABPROT 15.8*  INR 1.23   ABG    Component Value Date/Time   PHART 7.341* 08/05/2011 2234   HCO3 21.8 08/05/2011 2234   TCO2 23 08/05/2011 2234   ACIDBASEDEF 3.0* 08/05/2011 2234   O2SAT 97.0 08/05/2011 2234   CBG (last 3)   Basename 08/07/11 0758 08/07/11 0425 08/07/11 0006  GLUCAP 127* 123* 140*    Assessment/Plan:  1.  CV-  Maintaining SR.  Will continue Lopressor 12.5 mg p.o. Two times daily.  SBP remains              130-140's so will increase Lisinopril to 10 mg p.o. Daily. 2.  Volume overload- Patient continues to diurese well with daily Lasix.   Weight today is 63 kg (down                                    from 65.8 kg 11/3). 3.  Pulmonary - Wean O2 as tolerates.  Currently on 1L [place].  Encourage incentive spirometer and                           flutter valve. 4.  ABL anemia - Last H/H down to 8.3/25.4.  Will start Nu Iron and check CBC am. 5.  Remove EPW in a.m. If no arrythmias overnight. 6.  Likely discharge on Tuesday or Wednesday.    LOS: 5 days    [name] M 08/08/2011

## 2011-08-08 NOTE — Progress Notes (Signed)
Subjective:  No acute complaints today. Appears tired. No significant chest pain. Mild pain over the vein harvest site.  Objective:  Temp:  [98.8 F (37.1 C)] 98.8 F (37.1 C) (11/04 0637) Pulse Rate:  [81] 81  (11/04 0637) Resp:  [18] 18  (11/04 0637) BP: (144)/(58) 144/58 mmHg (11/04 0637) SpO2:  [95 %] 95 % (11/04 0637) Weight:  [63.005 kg (138 lb 14.4 oz)] 138 lb 14.4 oz (63.005 kg) (11/04 4098) Weight change:   Intake/Output from previous day: 11/03 0701 - 11/04 0700 In: 240 [P.O.:240] Out: -   Intake/Output from this shift:    Medications: Current Facility-Administered Medications  Medication Dose Route Frequency Provider Last Rate Last Dose  . acetaminophen (TYLENOL) tablet 500 mg  500 mg Oral Q6H Anh Phuong Thi Pham, PHARMD   500 mg at 08/08/11 0654  . acetaminophen (TYLENOL) tablet 650 mg  650 mg Oral Q6H PRN Anh Phuong Thi Pham, PHARMD      . ALPRAZolam Prudy Feeler) tablet 0.25 mg  0.25 mg Oral Q6H PRN Anh Phuong Thi Pham, PHARMD      . alum & mag hydroxide-simeth (MAALOX/MYLANTA) 200-200-20 MG/5ML suspension 15-30 mL  15-30 mL Oral Q4H PRN Anh Phuong Thi Pham, PHARMD      . aspirin EC tablet 325 mg  325 mg Oral Daily Anh Phuong Thi Pham, PHARMD      . bisacodyl (DULCOLAX) EC tablet 10 mg  10 mg Oral Daily PRN Anh Phuong Thi Pham, PHARMD       Or  . bisacodyl (DULCOLAX) suppository 10 mg  10 mg Rectal Daily PRN Anh Phuong Thi Pham, PHARMD      . Chlorhexidine Gluconate Cloth 2 % PADS 6 each  6 each Topical Q0600 Anh Phuong Thi Pham, PHARMD      . docusate sodium (COLACE) capsule 200 mg  200 mg Oral Daily Anh Phuong Thi Pham, PHARMD      . fenofibrate tablet 160 mg  160 mg Oral Daily Purcell Nails, MD      . furosemide (LASIX) tablet 40 mg  40 mg Oral Daily Anh Phuong Thi Pham, PHARMD      . lisinopril (PRINIVIL,ZESTRIL) tablet 10 mg  10 mg Oral Daily Donielle Margaretann Loveless, PA      . magnesium hydroxide (MILK OF MAGNESIA) suspension 30 mL  30 mL Oral Daily PRN Anh Phuong  Thi Pham, PHARMD      . metoprolol tartrate (LOPRESSOR) tablet 12.5 mg  12.5 mg Oral BID Anh Phuong Thi Pham, PHARMD   12.5 mg at 08/07/11 2200  . ondansetron (ZOFRAN) injection 4 mg  4 mg Intravenous Q6H PRN Anh Phuong Thi Pham, PHARMD      . ondansetron (ZOFRAN) tablet 4 mg  4 mg Oral Q6H PRN Anh Phuong Thi Pham, PHARMD      . oxyCODONE (Oxy IR/ROXICODONE) immediate release tablet 5-10 mg  5-10 mg Oral Q3H PRN Anh Phuong Thi Pham, PHARMD      . pantoprazole (PROTONIX) EC tablet 40 mg  40 mg Oral QAC breakfast [address] Thi Pham, PHARMD   40 mg at 08/08/11 0907  . polysaccharide iron (NIFEREX) capsule 150 mg  150 mg Oral Daily Donielle Margaretann Loveless, PA      . potassium chloride (KLOR-CON) packet 20 mEq  20 mEq Oral Daily Anh Phuong Thi Pham, [place]      . rosuvastatin (CRESTOR) tablet 20 mg  20 mg Oral q1800 [address] [place], PHARMD      .  sertraline (ZOLOFT) tablet 200 mg  200 mg Oral Daily Anh Phuong Thi Pham, PHARMD      . sodium chloride 0.9 % injection 3 mL  3 mL Intravenous Q12H Anh Phuong Thi Pham, PHARMD   3 mL at 08/07/11 2200  . traMADol (ULTRAM) tablet 50-100 mg  50-100 mg Oral Q4H PRN Anh Phuong Thi Pham, PHARMD      . DISCONTD: insulin aspart (novoLOG) injection 0-24 Units  0-24 Units Subcutaneous TID AC & HS Anh Phuong Thi Pham, PHARMD      . DISCONTD: lisinopril (PRINIVIL,ZESTRIL) tablet 5 mg  5 mg Oral Daily Anh Phuong Thi Arlester Marker, PHARMD        Physical Exam: General appearance: fatigued and no distress Neck: no adenopathy, no JVD, supple, symmetrical, trachea midline and thyroid not enlarged, symmetric, no tenderness/mass/nodules Lungs: clear to auscultation bilaterally Heart: regular rate and rhythm, S1, S2 normal, no murmur, click, rub or gallop Abdomen: soft, non-tender; bowel sounds normal; no masses,  no organomegaly Extremities: healing vein harvest incisions Pulses: weak, barely palpable pulses bilaterally PT Skin: Skin color, texture, turgor normal. No rashes or  lesions Neurologic: Grossly normal  Lab Results: Results for orders placed during the hospital encounter of 08/03/11 (from the past 48 hour(s))  GLUCOSE, CAPILLARY     Status: Abnormal   Collection Time   08/06/11 11:31 AM      Component Value Range Comment   Glucose-Capillary 128 (*) 70 - 99 (mg/dL)    Comment 1 Notify RN      Comment 2 Documented in Chart     GLUCOSE, CAPILLARY     Status: Abnormal   Collection Time   08/06/11  4:32 PM      Component Value Range Comment   Glucose-Capillary 132 (*) 70 - 99 (mg/dL)   CBC     Status: Abnormal   Collection Time   08/06/11  5:40 PM      Component Value Range Comment   WBC 12.4 (*) 4.0 - 10.5 (K/uL)    RBC 3.14 (*) 3.87 - 5.11 (MIL/uL)    Hemoglobin 9.0 (*) 12.0 - 15.0 (g/dL)    HCT 86.5 (*) 78.4 - 46.0 (%)    MCV 87.6  78.0 - 100.0 (fL)    MCH 28.7  26.0 - 34.0 (pg)    MCHC 32.7  30.0 - 36.0 (g/dL)    RDW 69.6 (*) 29.5 - 15.5 (%)    Platelets 149 (*) 150 - 400 (K/uL)   CREATININE, SERUM     Status: Normal   Collection Time   08/06/11  5:40 PM      Component Value Range Comment   Creatinine, Ser 0.76  0.50 - 1.10 (mg/dL)    GFR calc non Af Amer >90  >90 (mL/min)    GFR calc Af Amer >90  >90 (mL/min)   MAGNESIUM     Status: Normal   Collection Time   08/06/11  5:40 PM      Component Value Range Comment   Magnesium 2.4  1.5 - 2.5 (mg/dL)   GLUCOSE, CAPILLARY     Status: Abnormal   Collection Time   08/06/11  8:09 PM      Component Value Range Comment   Glucose-Capillary 139 (*) 70 - 99 (mg/dL)   GLUCOSE, CAPILLARY     Status: Abnormal   Collection Time   08/07/11 12:06 AM      Component Value Range Comment   Glucose-Capillary 140 (*) 70 - 99 (mg/dL)  GLUCOSE, CAPILLARY     Status: Abnormal   Collection Time   08/07/11  4:25 AM      Component Value Range Comment   Glucose-Capillary 123 (*) 70 - 99 (mg/dL)   GLUCOSE, CAPILLARY     Status: Abnormal   Collection Time   08/07/11  7:58 AM      Component Value Range Comment    Glucose-Capillary 127 (*) 70 - 99 (mg/dL)    Comment 1 Notify RN      Comment 2 Documented in Chart     BASIC METABOLIC PANEL     Status: Abnormal   Collection Time   08/07/11  9:46 AM      Component Value Range Comment   Sodium 137  135 - 145 (mEq/L)    Potassium 3.6  3.5 - 5.1 (mEq/L)    Chloride 101  96 - 112 (mEq/L)    CO2 28  19 - 32 (mEq/L)    Glucose, Bld 118 (*) 70 - 99 (mg/dL)    BUN 10  6 - 23 (mg/dL)    Creatinine, Ser 0.45  0.50 - 1.10 (mg/dL)    Calcium 8.7  8.4 - 10.5 (mg/dL)    GFR calc non Af Amer >90  >90 (mL/min)    GFR calc Af Amer >90  >90 (mL/min)   CBC     Status: Abnormal   Collection Time   08/07/11  9:46 AM      Component Value Range Comment   WBC 12.7 (*) 4.0 - 10.5 (K/uL)    RBC 2.86 (*) 3.87 - 5.11 (MIL/uL)    Hemoglobin 8.3 (*) 12.0 - 15.0 (g/dL)    HCT 40.9 (*) 81.1 - 46.0 (%)    MCV 88.8  78.0 - 100.0 (fL)    MCH 29.0  26.0 - 34.0 (pg)    MCHC 32.7  30.0 - 36.0 (g/dL)    RDW 91.4 (*) 78.2 - 15.5 (%)    Platelets 162  150 - 400 (K/uL)     Imaging: Dg Chest 2 View  08/07/2011  *RADIOLOGY REPORT*  Clinical Data: Status post CABG surgery.  CHEST - 2 VIEW  Comparison: 08/06/2011  Findings: Since the prior study, all lines and tubes have been removed with the exception of the right internal jugular Cordis. There is no pneumothorax.  The cardiac silhouette is mildly enlarged but normal in configuration.  There is no mediastinal widening.  There are small bilateral pleural effusions as well as lung base atelectasis.  There is no evidence of pulmonary edema.  IMPRESSION: Small bilateral effusions with associated atelectasis.  No pneumothorax or pulmonary edema.  No evidence of an operative complication.  Original Report Authenticated By:     Assessment:  1. Principal Problem: 2.  *CAD (coronary artery disease) 3. Active Problems: 4.  PAD (peripheral artery disease) 5.  Claudication 6.  AAA (abdominal aortic aneurysm) 7.  S/P CABG x 3 8.  Carotid  stenosis, bilateral 9.  Depression 10.  Fatigue 11.  GERD (gastroesophageal reflux disease) 12.  Anxiety 13.  Tobacco abuse 14.   Plan:  1. Continue current management per CT surgery. 2. Recommend active cardiac rehabilitation assessment 3. Consider decreasing pain medications due to sedation.  Time Spent Directly with Patient:  15 minutes  Length of Stay:  LOS: 5 days    HILTY,Kenneth C 08/08/2011, 10:41 AM

## 2011-08-09 LAB — CBC
HCT: 28.4 % — ABNORMAL LOW (ref 36.0–46.0)
MCH: 28.2 pg (ref 26.0–34.0)
MCV: 87.9 fL (ref 78.0–100.0)
Platelets: 234 10*3/uL (ref 150–400)
RDW: 15.3 % (ref 11.5–15.5)

## 2011-08-09 LAB — BASIC METABOLIC PANEL
BUN: 22 mg/dL (ref 6–23)
CO2: 26 mEq/L (ref 19–32)
GFR calc non Af Amer: 81 mL/min — ABNORMAL LOW (ref >90)
Glucose, Bld: 112 mg/dL — ABNORMAL HIGH (ref 70–99)
Potassium: 3.4 mEq/L — ABNORMAL LOW (ref 3.5–5.1)

## 2011-08-09 LAB — BLOOD GAS, ARTERIAL
Acid-Base Excess: 1.7 mmol/L (ref 0.0–2.0)
Drawn by: 296031
O2 Saturation: 89.1 %
TCO2: 26.2 mmol/L (ref 0–100)
pO2, Arterial: 56.7 mmHg — ABNORMAL LOW (ref 80.0–100.0)

## 2011-08-09 MED ORDER — POTASSIUM CHLORIDE CRYS ER 20 MEQ PO TBCR
40.0000 meq | EXTENDED_RELEASE_TABLET | Freq: Once | ORAL | Status: AC
  Administered 2011-08-09: 40 meq via ORAL
  Filled 2011-08-09: qty 1

## 2011-08-09 NOTE — Progress Notes (Signed)
  Subjective: The patient reports feeling some better.  Still sore and moving slow.  She reports that she does not have enough family members to provide 24 hour care following hospital discharge.  She denies shortness of breath.  She is eating reasonably well.  Bowels are working.  She is not ambulating much.  Objective: Vital signs in last 24 hours: Temp:  [97.7 F (36.5 C)-99.1 F (37.3 C)] 98.4 F (36.9 C) (11/05 0434) Pulse Rate:  [76-83] 76  (11/05 0434) Cardiac Rhythm:  [-] Normal sinus rhythm (11/04 1936) Resp:  [18-20] 18  (11/05 0434) BP: (102-145)/(42-64) 129/42 mmHg (11/05 0434) SpO2:  [92 %-97 %] 93 % (11/05 0434) FiO2 (%):  [1 %] 1 % (11/04 2018) Weight:  [137 lb 9.6 oz (62.415 kg)] 137 lb 9.6 oz (62.415 kg) (11/05 0434)  Hemodynamic parameters for last 24 hours:    Intake/Output from previous day: 11/04 0701 - 11/05 0700 In: 260 [P.O.:240] Out: -  Intake/Output this shift:    Physical Exam:  Patient is maintaining sinus rhythm.  Diminished breath sounds at both lung bases. No wheezes nor rhonchi.  No murmurs.  Sternal incision clean and dry.  Abdomen soft and non-tender. Minimal drainage from both thigh incisions.  No lower extremity edema.  Lab Results:  [facility] 08/09/11 0645 08/07/11 0946  WBC 12.0* 12.7*  HGB 9.1* 8.3*  HCT 28.4* 25.4*  PLT 234 162   BMET:  Basename 08/07/11 0946 08/06/11 1740  NA 137 --  K 3.6 --  CL 101 --  CO2 28 --  GLUCOSE 118* --  BUN 10 --  CREATININE 0.74 0.76  CALCIUM 8.7 --    PT/INR: No results found for this basename: LABPROT,INR in the last 72 hours ABG    Component Value Date/Time   PHART 7.341* 08/05/2011 2234   HCO3 21.8 08/05/2011 2234   TCO2 23 08/05/2011 2234   ACIDBASEDEF 3.0* 08/05/2011 2234   O2SAT 97.0 08/05/2011 2234   CBG (last 3)   Basename 08/07/11 0758 08/07/11 0425 08/07/11 0006  GLUCAP 127* 123* 140*    Assessment/Plan:  Stable course s/p CABG POD #4.  Limited mobility primarily due to  motivation.  Will likely need short term SNF placement.  D/C pacing wires and encourage mobility, ambulation and pulmonary toilet.   LOS: 6 days    Tyrianna Lightle H 08/09/2011

## 2011-08-09 NOTE — Progress Notes (Addendum)
Patient is sleeping but wakens easily.  She denies pain and follows simple commands including movement of all 4 extremities.  She ambulated 200 feet with assistance.  There are no focal findings on exam.  All vitals and labs are within normal limits, other than low PaO2 and serum potassium.  Will keep her on O2 and supplement potassium.  Will monitor closely and hold all sedatives.  Will hold Zoloft as well, as she had been receiving a high dose.  I discussed the patient's current condition and future needs at length over the telephone with the patient's son.  He spoke with a case manager today and we anticipate temporary SNF placement when she is ready for discharge, although she's clearly not ready yet.  All questions answered.

## 2011-08-09 NOTE — Progress Notes (Signed)
Psychosocial assessment in shadow chart. CSW initiating SNF search of [place] and surrounding areas, as pt lives alone, family can not provide 24 hour care.  Pt son would like to speak with Dr Cornelius Moras regarding pt medical condition, care, and prognosis.  CSW will continue to follow.

## 2011-08-09 NOTE — Progress Notes (Signed)
Met with pt to discuss discharge plans.  Patient somewhat confused, but states she does live alone.  She states that her son, Sherilyn Cooter ([phone]) lives in the area, but works and cannot care for her at [employer].  Nurse states pt very deconditioned, high fall risk.  States pt asking for rehab at discharge.  Will consult CSW to facilitate discharge to skilled nursing facility for rehab prior to dc home.  Will need PT and OT consults.  Will follow.

## 2011-08-09 NOTE — Progress Notes (Signed)
Attempted to pull pacing wires. Ventricular wires were removed successfully, however, met resistance with atrial wires. Notified pa (gina Collins) who removed the atrial wires. All wires were intact, and patient is resting comfortably, vitals were initiated.

## 2011-08-09 NOTE — Progress Notes (Signed)
UR Completed.  [name] [name] 08/09/2011  

## 2011-08-09 NOTE — Consult Note (Signed)
[name], [name] NO.:  [id number]  MEDICAL RECORD NO.:  [id number]  LOCATION:  2303                         FACILITY:  [facility]  PHYSICIAN:  Quita Skye. Hart Rochester, M.D.  DATE OF BIRTH:  08-25-1956  DATE OF CONSULTATION:  08/05/2011 DATE OF DISCHARGE:                                CONSULTATION   REFERRING PHYSICIAN:  Salvatore Decent. Cornelius Moras, MD  REASON FOR CONSULTATION:  Severe right carotid occlusive disease.  HISTORY OF PRESENT ILLNESS:  This 55 year old female is well known to me, having evaluated her for severe aortoiliac and femoral popliteal occlusive disease in August 2012.  She was found on angiography to have a dilated aorta with multiple stenotic lesions in both iliac systems as well as diffuse superficial femoral occlusive disease bilaterally.  She was scheduled for aortobifemoral bypass grafting after cardiac clearance.  Her Cardiolite was abnormal and a cardiac cath revealed significant coronary artery disease.  She is now scheduled for coronary artery bypass grafting.  She was found to have a severe (80-85%) right internal carotid stenosis on preoperative testing.  She denies any history of stroke.  She states that she does have occasionally some numbness in the left upper extremity which is related to chest pain but never independent of the chest pain.  She denies any episodes of amaurosis fugax, diplopia, blurred vision, or syncope.  CHRONIC MEDICAL PROBLEMS: 1. Small aortic aneurysm with severe iliac occlusive disease. 2. Depression. 3. Anxiety. 4. Acid reflux. 5. Lower extremity claudication. 6. Degenerative joint disease. 7. Coronary artery disease.  SOCIAL HISTORY:  She smokes a half to 1 pack of cigarettes per day for 30+ years.  Does not use alcohol.  FAMILY HISTORY:  Positive for diabetes in her mother and a sister.  REVIEW OF SYSTEMS:  The patient does have occasional squeezing discomfort in the chest with numbness into the left upper  extremity. She denies any discomfort in the left lower extremity or weakness in that area.  She does have occasional numbness and tingling in both lower extremities with ambulation.  Also complains of reflux esophagitis as well as multiple joint problems including her back.  Other systems are negative for complete review of systems.  PHYSICAL EXAMINATION:  VITAL SIGNS:  Blood pressure is 160/70, heart rate is 80, respirations 14. GENERAL:  She is chronically ill-appearing female in no apparent distress.  Alert and oriented x3. HEENT:  Normal for age. CHEST:  No rhonchi or wheezing. CARDIOVASCULAR:  Regular rhythm.  No murmurs. ABDOMEN:  Soft, nontender with no masses. EXTREMITIES:  Lower extremity exam reveals 2+ femoral pulses bilaterally with no popliteal or distal pulses palpable.  Both feet are cool but adequately perfused. NEUROLOGIC:  Normal. SKIN:  Free of rashes or ulcerations. MUSCULOSKELETAL:  Free of major deformities.  I reviewed her carotid duplex study performed at [facility] and this reveals what appears to be an 80-85% right internal carotid stenosis which is asymptomatic.  She has a moderate left internal carotid stenosis approximating 60%.  IMPRESSION: 1. Severe coronary artery disease requiring coronary artery bypass     grafting today. 2. Severe right internal carotid stenosis, moderate left internal  carotid stenosis-asymptomatic.  RECOMMENDATION:  Would proceed with coronary artery bypass grafting and will need a right carotid endarterectomy postoperatively in a few weeks. We will then consider aortobifemoral bypass grafting depending on her status.  Risks and benefits have been discussed with the patient.  She does understand.     Quita Skye Hart Rochester, M.D.     JDL/MEDQ  D:  08/05/2011  T:  08/05/2011  Job:  [id number]  Electronically Signed by [name] M.D. on 08/09/2011 02:25:09 PM

## 2011-08-09 NOTE — Progress Notes (Signed)
  Subjective: Called to see patient  - RN reports increasing lethargy throughout the day.  She apparently had a single dose of Ultram early this a.m., but has had no other narcotics today.  PT had a great deal of difficulty mobilizing her secondary to drowsiness and apparent sedation.    Objective: Vital signs in last 24 hours: Temp:  [97.7 F (36.5 C)-98.4 F (36.9 C)] 98.4 F (36.9 C) (11/05 0434) Pulse Rate:  [76-83] 78  (11/05 1551) Cardiac Rhythm:  [-] Normal sinus rhythm (11/05 0900) Resp:  [18-20] 18  (11/05 1416) BP: (111-134)/(42-62) 126/50 mmHg (11/05 1551) SpO2:  [91 %-93 %] 92 % (11/05 1551) FiO2 (%):  [1 %] 1 % (11/04 2018) Weight:  [137 lb 9.6 oz (62.415 kg)] 137 lb 9.6 oz (62.415 kg) (11/05 0434)  Hemodynamic parameters for last 24 hours:    Intake/Output from previous day: 11/04 0701 - 11/05 0700 In: 260 [P.O.:240] Out: -  Intake/Output this shift: Total I/O In: 120 [P.O.:120] Out: 150 [Urine:150]  Pt. is lying in bed in no acute distress. Can tell me her name, and shakes head when asked if she is in pain.  Drowsy, but arousable.  Not really following commands secondary to sedation.  Lab Results:  [facility] 08/09/11 0645 08/07/11 0946  WBC 12.0* 12.7*  HGB 9.1* 8.3*  HCT 28.4* 25.4*  PLT 234 162   BMET:  Basename 08/07/11 0946 08/06/11 1740  NA 137 --  K 3.6 --  CL 101 --  CO2 28 --  GLUCOSE 118* --  BUN 10 --  CREATININE 0.74 0.76  CALCIUM 8.7 --    PT/INR: No results found for this basename: LABPROT,INR in the last 72 hours ABG    Component Value Date/Time   PHART 7.341* 08/05/2011 2234   HCO3 21.8 08/05/2011 2234   TCO2 23 08/05/2011 2234   ACIDBASEDEF 3.0* 08/05/2011 2234   O2SAT 97.0 08/05/2011 2234   CBG (last 3)   Basename 08/07/11 0758 08/07/11 0425 08/07/11 0006  GLUCAP 127* 123* 140*    Assessment/Plan: S/P   Discussed with M.D.  Will check a stat ABG, BMET and CBG.  Will D/C all narcotic medications.  May need CT if her  status does not improve.  M.D. to see.   LOS: 6 days    Selassie Spatafore H 08/09/2011

## 2011-08-09 NOTE — Progress Notes (Signed)
Physical Therapy Evaluation Patient Details Name: RHONA FUSILIER MRN: [id number] DOB: 1955-10-05 Today's Date: 08/09/2011  Problem List:  Patient Active Problem List  Diagnoses  . PAD (peripheral artery disease)  . Claudication  . AAA (abdominal aortic aneurysm)  . CAD (coronary artery disease)  . S/P CABG x 3  . Carotid stenosis, bilateral  . Depression  . Fatigue  . GERD (gastroesophageal reflux disease)  . Anxiety  . Tobacco abuse    Past Medical History:  Past Medical History  Diagnosis Date  . Aortic aneurysm   . Depression   . Anxiety   . Acid reflux   . Claudication   . DJD (degenerative joint disease) of lumbar spine   . Coronary artery disease    Past Surgical History:  Past Surgical History  Procedure Date  . Cholecystectomy   . Abdominal hysterectomy   . Spine surgery 2000 & 2002    lumbar laminectiomies  . Aortogram with runoff 04/27/2011    Bilateral runoff by Dr. Myra Gianotti    PT Assessment/Plan/Recommendation PT Assessment Clinical Impression Statement: pt would benefit from skilled PT for increased functional mobility and to maximize Independence PT Recommendation/Assessment: Patient will need skilled PT in the acute care venue PT Problem List: Decreased strength;Decreased activity tolerance;Decreased balance;Decreased mobility;Decreased coordination;Decreased cognition;Decreased knowledge of use of DME;Decreased safety awareness;Decreased knowledge of precautions;Cardiopulmonary status limiting activity Barriers to Discharge: Decreased caregiver support;Other (comment) (Decreased Cognition) PT Therapy Diagnosis : Difficulty walking;Altered mental status PT Plan PT Frequency: Min 2X/week PT Treatment/Interventions: DME instruction;Gait training;Functional mobility training;Therapeutic exercise;Balance training;Neuromuscular re-education;Cognitive remediation;Patient/family education PT Recommendation Recommendations for Other Services: Speech  consult;Other (comment) (SLP for Cognitive Eval) Follow Up Recommendations: Skilled nursing facility Equipment Recommended: Defer to next venue PT Goals  Acute Rehab PT Goals PT Goal Formulation: Patient unable to participate in goal setting Time For Goal Achievement: 2 weeks Pt will Roll Supine to Right Side: with supervision PT Goal: Rolling Supine to Right Side - Progress: Progressing toward goal Pt will Roll Supine to Left Side: with supervision PT Goal: Rolling Supine to Left Side - Progress: Progressing toward goal Pt will go Supine/Side to Sit: with min assist PT Goal: Supine/Side to Sit - Progress: Progressing toward goal Pt will Transfer Sit to Stand/Stand to Sit: with min assist PT Transfer Goal: Sit to Stand/Stand to Sit - Progress: Progressing toward goal Pt will Ambulate: >150 feet;with min assist;with rolling walker PT Goal: Ambulate - Progress: Progressing toward goal  PT Evaluation Precautions/Restrictions  Precautions Precautions: Sternal Prior Functioning  Home Living Lives With: Alone Receives Help From: Family (Son per chart, but pt tells this PT she doesn't no family) Additional Comments: pt very confused and not reliable with any answers Prior Function Level of Independence: Other (comment) Cognition Cognition Arousal/Alertness: Other (comment) (Very flat affect, drowsy, but not lethargic) Overall Cognitive Status: Impaired Attention: Impaired Current Attention Level: Focused Memory: Appears impaired Memory Deficits: Impaired ST and LT memory Orientation Level: Other (Comment);Disoriented to place;Disoriented to time;Disoriented to situation (Oriented to person only.  ) Following Commands: Follows one step commands inconsistently Safety/Judgement: Decreased awareness of safety precautions;Decreased safety judgement for tasks assessed Decreased Safety/Judgement: Decreased awareness of need for assistance Awareness of Deficits: Decreased awareness of  deficits Sensation/Coordination   Extremity Assessment RLE Assessment RLE Assessment: Exceptions to [facility] RLE Strength RLE Overall Strength: Due to impaired cognition;Other (Comment) (pt not following directions consistently, grossly >3/5) LLE Assessment LLE Assessment: Exceptions to [facility] LLE Strength LLE Overall Strength: Due to impaired cognition;Other (Comment) (pt  not following directions consistently.  Grossly >3/5) Mobility (including Balance) Bed Mobility Bed Mobility: Yes Supine to Sit: 2: Max assist Supine to Sit Details (indicate cue type and reason): Max cueing for participation and sternal precautions Sit to Supine - Left: 3: Mod assist Sit to Supine - Left Details (indicate cue type and reason): Max cueing for sternal precautions Transfers Transfers: Yes Sit to Stand: 3: Mod assist;From bed Sit to Stand Details (indicate cue type and reason): cues for UE use and encouragement Stand to Sit: 3: Mod assist;To bed Stand to Sit Details: Cues to control descent and UE use Ambulation/Gait Ambulation/Gait: No Stairs: No  Balance Balance Assessed: Yes Static Sitting Balance Static Sitting - Balance Support: Bilateral upper extremity supported;Feet supported Static Sitting - Level of Assistance: 4: Min assist Static Sitting - Comment/# of Minutes: EOB with pt leaning posteriorly.  Needs VCs and TCs to correct.   Exercise    End of Session PT - End of Session Equipment Utilized During Treatment: Gait belt Activity Tolerance: Patient limited by fatigue;Other (comment) (Limited by Cognition) Patient left: in bed;with call bell in reach;with bed alarm set Nurse Communication: Other (comment) (Noted pt's cognitive deficits.  ) General Behavior During Session: Flat affect Cognition: Impaired  MCGROGAN,Deetta Siegmann 08/09/2011, 3:41 PM

## 2011-08-09 NOTE — Progress Notes (Signed)
CARDIAC REHAB PHASE I   PRE:  Rate/Rhythm: 90 NSR  BP:  Supine: 118/58  Sitting:   Standing:    SaO2: 94%1L  MODE:  Ambulation: 200 ft   POST:  Rate/Rhythem: 110ST  BP:  Supine:   Sitting: 131/53  Standing:    SaO2: 96%RA  Duanne Limerick [id number] Pt blank stare. Slow to respond. Ambulated 200 ft on RA with asst x2 with rolling walker. Had to sit down in chair in hallway due to dizziness. To recliner with call light. Left on RA. Notified RN.

## 2011-08-09 NOTE — Progress Notes (Signed)
  [facility] DAILY PROGRESS NOTE  [name]   [id number] 1955-12-20     Subjective:  Negative CP, SOB  Objective:  Temp:  [97.7 F (36.5 C)-99.1 F (37.3 C)] 98.4 F (36.9 C) (11/05 0434) Pulse Rate:  [76-83] 76  (11/05 0434) Resp:  [18-20] 18  (11/05 0434) BP: (102-129)/(42-62) 129/42 mmHg (11/05 0434) SpO2:  [92 %-97 %] 93 % (11/05 0434) FiO2 (%):  [1 %] 1 % (11/04 2018) Weight:  [62.415 kg (137 lb 9.6 oz)] 137 lb 9.6 oz (62.415 kg) (11/05 0434) Weight change: -0.59 kg (-1 lb 4.8 oz)  Intake/Output from previous day: 11/04 0701 - 11/05 0700 In: 260 [P.O.:240] Out: -  Intake/Output from this shift:    Physical Exam: General appearance: alert, cooperative and no distress Lungs: clear to auscultation bilaterally and Decreased BS bilaterally.  Poor effort. Heart: regular rate and rhythm, S1, S2 normal, no murmur, click, rub or gallop Extremities: No LEE Pulses: 2+ radials, decrease DPs.  LE warm.  Lab Results: Results for orders placed during the hospital encounter of 08/03/11 (from the past 48 hour(s))  CBC     Status: Abnormal   Collection Time   08/09/11  6:45 AM      Component Value Range Comment   WBC 12.0 (*) 4.0 - 10.5 (K/uL)    RBC 3.23 (*) 3.87 - 5.11 (MIL/uL)    Hemoglobin 9.1 (*) 12.0 - 15.0 (g/dL)    HCT 40.9 (*) 81.1 - 46.0 (%)    MCV 87.9  78.0 - 100.0 (fL)    MCH 28.2  26.0 - 34.0 (pg)    MCHC 32.0  30.0 - 36.0 (g/dL)    RDW 91.4  78.2 - 95.6 (%)    Platelets 234  150 - 400 (K/uL)     Imaging:    Echo:  Cath:   MAR Reviewed  Treatment Team:  Varney Biles, MD  Assessment/Plan:   1. Principal Problem: 2.  *CAD (coronary artery disease) 3. Active Problems: 4.  PAD (peripheral artery disease) 5.  Claudication 6.  AAA (abdominal aortic aneurysm) 7.  S/P CABG x 3 8.  Carotid stenosis, bilateral 9.  Depression 10.  Fatigue 11.  GERD (gastroesophageal reflux disease) 12.   Anxiety 13.  Tobacco abuse 14.  15.   (PLAN) BP/HR stable.  Pt ambulating.  SNF placement pending. HGB stable  Time Spent Directly with Patient:  5 minutes  Length of Stay:  LOS: 6 days    HAGER,BRYAN W PA-C 08/09/2011, 11:41 AM   Pt. Seen and examined. Agree with the NP/PA-C note as written. She is more sedated today .Marland Kitchen Actually, somewhat delerious. Unable to participate with pt. Not much appetite. Would suggest work-up for metabolic encephalopathy, decrease pain medication if able. Consider neruology evaluation as well. I discussed this with Coral Ceo, PA-C with [facility].

## 2011-08-10 NOTE — Progress Notes (Signed)
Pt ambulated 150 feet on RA. Pt's O2 sats were 91%. Pt tolerated activity well and is now back in bed.  Carlyle Lipa, RN

## 2011-08-10 NOTE — Progress Notes (Signed)
CARDIAC REHAB PHASE I   PRE:  Rate/Rhythm: 77SR  BP:  Supine:   Sitting: 106/70  Standing:    SaO2: 93%RA  MODE:  Ambulation: 250 ft   POST:  Rate/Rhythem: 88SR  BP:  Supine: 131/61  Sitting:   Standing:                          SaO2:93%RA  Pt. Ambulated 250 ft with asst x 2 with rolling walker. Very weak. Tires easily. Has difficulty gripping with right hand. Worse as she becomes tired. Had to sit in chair in hallway twice due to fatigue. To bed after walk. Needs asst x 2 to walk. Duanne Limerick

## 2011-08-10 NOTE — Progress Notes (Addendum)
  Subjective: Nurse reports patient with confusion most of yesterday and last evening.  This am, however, she is awake, alert,oriented to person, place, and time.  She has no complaints.         Vital signs in last 24 hours: Patient Vitals for the past 24 hrs:  BP Temp Temp src Pulse Resp SpO2 Weight  08/10/11 0410 141/41 mmHg 97.6 F (36.4 C) Oral 78  18  94 % -  08/10/11 0400 - - - - - - 131 lb (59.421 kg)  08/09/11 2256 114/70 mmHg - - 78  - - -  08/09/11 1900 103/54 mmHg 98.2 F (36.8 C) Oral 74  16  97 % -  08/09/11 1615 100/46 mmHg - - - - - -  08/09/11 1551 126/50 mmHg - - 78  - 92 % -  08/09/11 1550 126/50 mmHg - - - - - -  08/09/11 1416 134/47 mmHg - - 79  18  91 % -     Hemodynamic parameters for last 24 hours:    Intake/Output from previous day: 11/05 0701 - 11/06 0700 In: 360 [P.O.:360] Out: 225 [Urine:225] Intake/Output this shift:    Physical Exam:  Cardiovascular: RRR, no murmurs, gallops, or rubs. Pulmonary: Clear to auscultation bilaterally;no rales, wheezes, or rhonchi. Abdomen: Soft, non tender, bowel sounds present. Extremities: Trace bilateral lower extremity edema. Wounds: Clean and dry.  No erythema or signs of infection. Neurological:  No focal deficits.  Lab Results: CBC: Basename 08/09/11 0645 08/07/11 0946  WBC 12.0* 12.7*  HGB 9.1* 8.3*  HCT 28.4* 25.4*  PLT 234 162   BMET:  Basename 08/09/11 1619 08/07/11 0946  NA 137 137  K 3.4* 3.6  CL 101 101  CO2 26 28  GLUCOSE 112* 118*  BUN 22 10  CREATININE 0.80 0.74  CALCIUM 8.9 8.7    PT/INR: No results found for this basename: LABPROT,INR in the last 72 hours ABG:  INR: Will add last result for INR, ABG once components are confirmed Will add last 4 CBG results once components are confirmed  Assessment/Plan:  1. CV - SR. Continue with current medications. 2.  Pulmonary - Stable. 3. Volume Overload - Patient is below pre op weight.  No longer being diuresed. 4.  ABL anemia  - Last H/H 9.1/28.4 stable. 5. Patient not confused this am.  Perhaps, was secondary to high dose of Zoloft, which has now been stopped. 6.  Will likely need short term placement upon d/c as son who lives locally works.  Possibly d/c in am.    Ardelle Balls, PA 08/10/2011    I have seen and examined the patient and agree with the assessment and plan as outlined.

## 2011-08-10 NOTE — Progress Notes (Addendum)
[facility] DAILY PROGRESS NOTE  [name]   [id number] [birth year]     Subjective:  No Complaints. NO SOB  Objective:  Temp:  [97.6 F (36.4 C)-98.2 F (36.8 C)] 98 F (36.7 C) (11/06 1300) Pulse Rate:  [74-78] 77  (11/06 1300) Resp:  [16-18] 18  (11/06 1300) BP: (100-141)/(41-70) 106/70 mmHg (11/06 1300) SpO2:  [92 %-97 %] 93 % (11/06 1300) Weight:  [59.421 kg (131 lb)] 131 lb (59.421 kg) (11/06 0400) Weight change: -2.994 kg (-6 lb 9.6 oz)  Intake/Output from previous day: 11/05 0701 - 11/06 0700 In: 360 [P.O.:360] Out: 225 [Urine:225] Intake/Output from this shift: Total I/O In: 483 [P.O.:480; I.V.:3] Out: 100 [Urine:100]  Physical Exam: General appearance: alert, cooperative and no distress Neck: no JVD Lungs: diminished breath sounds bibasilar Heart: regular rate and rhythm Abdomen: soft, non-tender; bowel sounds normal; no masses,  no organomegaly Extremities: Trace LEE  Lab Results: Results for orders placed during the hospital encounter of 08/03/11 (from the past 48 hour(s))  CBC     Status: Abnormal   Collection Time   08/09/11  6:45 AM      Component Value Range Comment   WBC 12.0 (*) 4.0 - 10.5 (K/uL)    RBC 3.23 (*) 3.87 - 5.11 (MIL/uL)    Hemoglobin 9.1 (*) 12.0 - 15.0 (g/dL)    HCT 40.9 (*) 81.1 - 46.0 (%)    MCV 87.9  78.0 - 100.0 (fL)    MCH 28.2  26.0 - 34.0 (pg)    MCHC 32.0  30.0 - 36.0 (g/dL)    RDW 91.4  78.2 - 95.6 (%)    Platelets 234  150 - 400 (K/uL)   BLOOD GAS, ARTERIAL     Status: Abnormal   Collection Time   08/09/11  4:15 PM      Component Value Range Comment   FIO2 .21      pH, Arterial 7.469 (*) 7.350 - 7.400     pCO2 arterial 35.0  35.0 - 45.0 (mmHg)    pO2, Arterial 56.7 (*) 80.0 - 100.0 (mmHg)    Bicarbonate 25.1 (*) 20.0 - 24.0 (mEq/L)    TCO2 26.2  0 - 100 (mmol/L)    Acid-Base Excess 1.7  0.0 - 2.0 (mmol/L)    O2 Saturation 89.1      Patient temperature 98.6      Collection site  LEFT RADIAL      Drawn by [id number]      Sample type ARTERIAL DRAW      Allens test (pass/fail) PASS  PASS    BASIC METABOLIC PANEL     Status: Abnormal   Collection Time   08/09/11  4:19 PM      Component Value Range Comment   Sodium 137  135 - 145 (mEq/L)    Potassium 3.4 (*) 3.5 - 5.1 (mEq/L)    Chloride 101  96 - 112 (mEq/L)    CO2 26  19 - 32 (mEq/L)    Glucose, Bld 112 (*) 70 - 99 (mg/dL)    BUN 22  6 - 23 (mg/dL)    Creatinine, Ser 5.78  0.50 - 1.10 (mg/dL)    Calcium 8.9  8.4 - 10.5 (mg/dL)    GFR calc non Af Amer 81 (*) >90 (mL/min)    GFR calc Af Amer >90  >90 (mL/min)   GLUCOSE, CAPILLARY     Status: Abnormal   Collection Time   08/09/11  4:25 PM      Component Value Range Comment   Glucose-Capillary 120 (*) 70 - 99 (mg/dL)     Imaging:  Echo: NST:    Treatment Team:  Varney Biles, MD  Assessment/Plan:   Principal Problem:  *CAD (coronary artery disease) Active Problems:  PAD (peripheral artery disease)  Claudication  AAA (abdominal aortic aneurysm)  S/P CABG x 3  Carotid stenosis, bilateral  Depression  Fatigue  GERD (gastroesophageal reflux disease)  Anxiety  Tobacco abuse   PLAN:  CABG- POD #5.  Continue to Monitor.  Patient is more alert today. Continue diuresis. F/U anemia. Pt seen and examined and agree with assessment and plan. TK Length of Stay:  LOS: 7 days    HAGER,BRYAN W PA-C 08/10/2011, 3:07 PM  Lovette Merta A 08/10/2011 3:16 PM

## 2011-08-10 NOTE — Progress Notes (Signed)
Pt was hard to arouse  and refused last walk on unit last night . Carlyle Lipa, RN

## 2011-08-10 NOTE — Progress Notes (Signed)
Occupational Therapy Evaluation Patient Details Name: PATTIANN SOLANKI MRN: [id number] DOB: [birth year] Today's Date: 08/10/2011  Problem List:  Patient Active Problem List  Diagnoses  . PAD (peripheral artery disease)  . Claudication  . AAA (abdominal aortic aneurysm)  . CAD (coronary artery disease)  . S/P CABG x 3  . Carotid stenosis, bilateral  . Depression  . Fatigue  . GERD (gastroesophageal reflux disease)  . Anxiety  . Tobacco abuse    Past Medical History:  Past Medical History  Diagnosis Date  . Aortic aneurysm   . Depression   . Anxiety   . Acid reflux   . Claudication   . DJD (degenerative joint disease) of lumbar spine   . Coronary artery disease    Past Surgical History:  Past Surgical History  Procedure Date  . Cholecystectomy   . Abdominal hysterectomy   . Spine surgery 2000 & 2002    lumbar laminectiomies  . Aortogram with runoff 04/27/2011    Bilateral runoff by Dr. Myra Gianotti    OT Assessment/Plan/Recommendation OT Assessment Clinical Impression Statement: Pt. will benefit from OT in the acute setting to increase independence with ADLs and decrease burden of care at next venue of care OT Recommendation/Assessment: Patient will need skilled OT in the acute care venue OT Problem List: Decreased strength;Decreased range of motion;Decreased activity tolerance;Impaired balance (sitting and/or standing);Decreased cognition;Decreased safety awareness;Decreased knowledge of use of DME or AE;Decreased knowledge of precautions;Cardiopulmonary status limiting activity Barriers to Discharge: Decreased caregiver support OT Therapy Diagnosis : Generalized weakness OT Plan OT Frequency: Min 1X/week OT Treatment/Interventions: Self-care/ADL training;DME and/or AE instruction;Therapeutic activities;Patient/family education;Balance training OT Recommendation Follow Up Recommendations: Skilled nursing facility Equipment Recommended: Defer to next venue Individuals  Consulted Consulted and Agree with Results and Recommendations: Patient OT Goals Acute Rehab OT Goals OT Goal Formulation: With patient Time For Goal Achievement: 2 weeks ADL Goals Pt Will Perform Grooming: with set-up;Standing at sink;with supervision Pt Will Perform Upper Body Bathing: with set-up;Sitting, chair Pt Will Perform Upper Body Dressing: with set-up;Sitting, chair Pt Will Perform Lower Body Dressing: with set-up;with adaptive equipment;Sit to stand from bed Pt Will Transfer to Toilet: with set-up;with supervision;Stand pivot transfer;3-in-1 Pt Will Perform Toileting - Hygiene: with set-up;Sit to stand from 3-in-1/toilet  OT Evaluation Precautions/Restrictions  Precautions Precautions: Sternal Restrictions Weight Bearing Restrictions: No Prior Functioning Home Living Lives With: Alone Receives Help From: Family Additional Comments: Pt. continues to demonstrate increased confusion and not reliable with home living answers   ADL ADL Eating/Feeding: Performed;Set up Where Assessed - Eating/Feeding: Chair Grooming: Performed;Wash/dry hands;Minimal assistance;Other (comment);Set up (Min A for support) Grooming Details (indicate cue type and reason): Mod verbal cues for safety with RW and with reaching for paper towel  Where Assessed - Grooming: Standing at sink Upper Body Bathing: Not assessed Lower Body Bathing: Not assessed Upper Body Dressing: Performed;Minimal assistance (with don gown) Upper Body Dressing Details (indicate cue type and reason): Min verbal cues to complete task Where Assessed - Upper Body Dressing: Sitting, bed Lower Body Dressing: Simulated;+1 Total assistance Where Assessed - Lower Body Dressing: Sit to stand from bed Toilet Transfer: Performed;Moderate assistance Toilet Transfer Method: Proofreader: Doctor, general practice - Clothing Manipulation: Not assessed Where Assessed - Toileting Clothing Manipulation: Not  assessed Toileting - Hygiene: Moderate assistance;Performed Toileting - Hygiene Details (indicate cue type and reason): Assist for thoroughness due to pt. with decreased cognition with completing through hygiene Where Assessed - Toileting Hygiene: Sit to stand from 3-in-1 or toilet  Tub/Shower Transfer: Not assessed Tub/Shower Transfer Method: Not assessed Equipment Used: Rolling walker ADL Comments: Pt. with decreased cognition effecting participation with ADLs and requiring constant verbal cuing for safety and efficiency of ADLs Vision/Perception  Vision - History Baseline Vision: No visual deficits Patient Visual Report: No change from baseline Vision - Assessment Eye Alignment: Within Functional Limits Cognition Cognition Arousal/Alertness: Awake/alert Overall Cognitive Status: Impaired Attention: Impaired Current Attention Level: Sustained Memory: Appears impaired Memory Deficits: Impaired ST and LT memory Orientation Level: Disoriented to time;Oriented to person;Oriented to place (Pt. asked therapist what day was today ) Following Commands: Follows one step commands consistently;Follows multi-step commands inconsistently Safety/Judgement: Decreased awareness of safety precautions;Decreased safety judgement for tasks assessed Decreased Safety/Judgement: Decreased awareness of need for assistance Awareness of Deficits: Decreased awareness of deficits Problem Solving: Requires assistance for problem solving Cognition - Other Comments: With use of RW and manuvering safely in bathroom and with ambulation through doorways     Extremity Assessment RUE Assessment RUE Assessment: Within Functional Limits LUE Assessment LUE Assessment: Within Functional Limits Mobility  Bed Mobility Bed Mobility: Yes Supine to Sit: 2: Max assist Supine to Sit Details (indicate cue type and reason): Max verbal cues to follow sternal precautions Sit to Supine - Left: 3: Mod assist Sit to Supine -  Left Details (indicate cue type and reason): Cues for sternal precautions Transfers Transfers: Yes Sit to Stand: 3: Mod assist;From bed Sit to Stand Details (indicate cue type and reason): Mod verbal cues for hand placement on knees to follow sternal precautions Stand to Sit: 4: Min assist;To chair/3-in-1 Stand to Sit Details: Cues for hands on knees    End of Session OT - End of Session Equipment Utilized During Treatment: Gait belt Activity Tolerance: Patient tolerated treatment well Patient left: in chair;with call bell in reach;Other (comment) (and chair alarm) Nurse Communication: Mobility status for transfers;Mobility status for ambulation General Behavior During Session: Flat affect Cognition: Impaired   George Haggart, OTR/L 08/10/2011, 3:18 PM

## 2011-08-10 NOTE — Progress Notes (Signed)
CSW faxed pt out to SNF facilities in large geographical area, due to Medicaid pending status. Will work to find bed as soon as possible, noting pt likely ready to d/c tomorrow, however insurance will be a barrier. CSW will continue to follow.

## 2011-08-11 MED ORDER — LISINOPRIL 10 MG PO TABS: 10.0000 mg | ORAL_TABLET | Freq: Every day | ORAL | Status: DC

## 2011-08-11 MED ORDER — ENSURE PUDDING PO PUDG
1.0000 | Freq: Three times a day (TID) | ORAL | Status: DC
  Administered 2011-08-11 – 2011-08-12 (×3): 1 via ORAL

## 2011-08-11 MED ORDER — ASPIRIN 325 MG PO TBEC: 325.0000 mg | DELAYED_RELEASE_TABLET | Freq: Every day | ORAL | Status: DC

## 2011-08-11 MED ORDER — ROSUVASTATIN CALCIUM 20 MG PO TABS: 20.0000 mg | ORAL_TABLET | Freq: Every day | ORAL | Status: DC

## 2011-08-11 MED ORDER — METOPROLOL TARTRATE 12.5 MG HALF TABLET: 12.5000 mg | ORAL_TABLET | Freq: Two times a day (BID) | ORAL | Status: DC

## 2011-08-11 MED ORDER — POLYSACCHARIDE IRON 150 MG PO CAPS: 150.0000 mg | ORAL_CAPSULE | Freq: Every day | ORAL | Status: DC

## 2011-08-11 MED ORDER — FENOFIBRATE 160 MG PO TABS: 160.0000 mg | ORAL_TABLET | Freq: Every day | ORAL | Status: DC

## 2011-08-11 NOTE — Discharge Summary (Signed)
I agree with the discharge summary as outlined above.

## 2011-08-11 NOTE — Progress Notes (Signed)
CARDIAC REHAB PHASE I   PRE:  Rate/Rhythm: 89SR  BP:  Supine:   Sitting: 137/41  Standing:    SaO2: 93%RA  MODE:  Ambulation: 350 ft   POST:  Rate/Rhythem: 108ST  BP:  Supine:   Sitting: 134/34  Standing:    SaO2: 99%RA  Ambulated 350 ft with rolling walker on RA and asstx2. Followed with chair. Pt gives very little warning when she wants to sit down. Fatigues quickly. Takes very small steps. Gripping a little better with right hand. To chair with call bell.  [id number]  Duanne Limerick

## 2011-08-11 NOTE — Progress Notes (Signed)
[facility] DAILY PROGRESS NOTE  [name]   [id number] 1955/11/20     Subjective:  Chest sore.     Objective:  Temp:  [98 F (36.7 C)-99 F (37.2 C)] 99 F (37.2 C) (11/07 0520) Pulse Rate:  [77-86] 84  (11/07 0520) Resp:  [16-18] 18  (11/07 0520) BP: (106-158)/(60-70) 123/61 mmHg (11/07 0520) SpO2:  [91 %-93 %] 93 % (11/07 0520) Weight:  [55.1 kg (121 lb 7.6 oz)] 121 lb 7.6 oz (55.1 kg) (11/07 0520) Weight change: 0 kg (0 lb)  Intake/Output from previous day: 11/06 0701 - 11/07 0700 In: 963 [P.O.:960; I.V.:3] Out: 100 [Urine:100] Intake/Output from this shift:    Physical Exam: General appearance: alert, cooperative and no distress Lungs: clear to auscultation bilaterally Heart: regular rate and rhythm, S1, S2 normal, no murmur, click, rub or gallop Abdomen: +BS, nontender Extremities: No edema  Lab Results: Results for orders placed during the hospital encounter of 08/03/11 (from the past 48 hour(s))  BLOOD GAS, ARTERIAL     Status: Abnormal   Collection Time   08/09/11  4:15 PM      Component Value Range Comment   FIO2 .21      pH, Arterial 7.469 (*) 7.350 - 7.400     pCO2 arterial 35.0  35.0 - 45.0 (mmHg)    pO2, Arterial 56.7 (*) 80.0 - 100.0 (mmHg)    Bicarbonate 25.1 (*) 20.0 - 24.0 (mEq/L)    TCO2 26.2  0 - 100 (mmol/L)    Acid-Base Excess 1.7  0.0 - 2.0 (mmol/L)    O2 Saturation 89.1      Patient temperature 98.6      Collection site LEFT RADIAL      Drawn by [id number]      Sample type ARTERIAL DRAW      Allens test (pass/fail) PASS  PASS    BASIC METABOLIC PANEL     Status: Abnormal   Collection Time   08/09/11  4:19 PM      Component Value Range Comment   Sodium 137  135 - 145 (mEq/L)    Potassium 3.4 (*) 3.5 - 5.1 (mEq/L)    Chloride 101  96 - 112 (mEq/L)    CO2 26  19 - 32 (mEq/L)    Glucose, Bld 112 (*) 70 - 99 (mg/dL)    BUN 22  6 - 23 (mg/dL)    Creatinine, Ser 8.29  0.50 - 1.10 (mg/dL)    Calcium 8.9  8.4  - 10.5 (mg/dL)    GFR calc non Af Amer 81 (*) >90 (mL/min)    GFR calc Af Amer >90  >90 (mL/min)   GLUCOSE, CAPILLARY     Status: Abnormal   Collection Time   08/09/11  4:25 PM      Component Value Range Comment   Glucose-Capillary 120 (*) 70 - 99 (mg/dL)       Treatment Team:  Varney Biles, MD  Assessment/Plan:   Principal Problem:  *CAD (coronary artery disease) Active Problems:  PAD (peripheral artery disease)  Claudication  AAA (abdominal aortic aneurysm)  S/P CABG x 3  Carotid stenosis, bilateral  Depression  Fatigue  GERD (gastroesophageal reflux disease)  Anxiety  Tobacco abuse   PLAN: POD #6,  BP, HR controlled.  Nurses report confusion yesterday.  Pt alert and oriented to place and year.  Plan to dc to SNF.  Time Spent Directly with Patient:  Length of Stay:  LOS: 8 days    HAGER,BRYAN W PA-C 08/11/2011, 9:57 AM    Pt. Seen and examined. Agree with the NP/PA-C note as written. More clear today .Marland Kitchen Perhaps medications wearing off.  Agree with SNF .Marland Kitchen She will need vascular intervention for PAD at some point.

## 2011-08-11 NOTE — Progress Notes (Signed)
Pt to d/c tomorrow to [facility]F. Pt son will transport her around lunchtime. CSW will coordinate transfer with facility.

## 2011-08-11 NOTE — Discharge Summary (Signed)
[name] Medical record #[id number] Date of admission 08/02/2011  The patient is a 55 year old white female with a history of peripheral vascular disease and tobacco abuse. The patient received in transfer from [facility] after she presented with complaints of chest pain. She states that her pain began about 3 days ago. It comes and goes without any notable aggravating factors. She describes the episodes as a heaviness like someone is sitting on her chest. The pain lasts for 3-4 minutes and goes away. He does state that she has had experience with this while walking however other times has occurred while at rest. She reports that the pain does not radiate down her left arm. She has associated shortness of breath and palpitations. She has significant peripheral vascular disease and underwent peripheral vascular angiogram with findings of diffuse common and external iliac disease as well as anuria small changes in her aorta and aortobifemoral bypass was recommended in August. She underwent Myoview preoperatively which was abnormal and her surgery has not been scheduled because of that. She presented to the emergency department at the advice of her mental health physician as her blood pressure was 200/130 during the visit. She has been treated with nitroglycerin and heparin and aspirin and morphine in the emergency department at any pain hospital and became pain-free. Her initial EKG revealed sinus rhythm with ST depression in the inferior and lateral leads. Her troponin was negative. She denied any fevers or chills. She does have claudication. The patient did not have any coughing or wheezing. She had no vomiting or nausea. She did not have any orthopnea paroxysmal nocturnal dyspnea or lower extremity edema. She was transferred to [facility] for further evaluation and treatment to Dr. Clayborne Dana service.  Past medical history: #1 peripheral vascular disease #2 claudication #3 depression #4 anxiety #5 acid  reflux disease #6 degenerative joint disease #7 abnormal Myoview August 2012  Family history: Positive for diabetes mellitus  Social history: She smokes a half pack of cigarettes a day and has done so for 30 years. She is divorced. She has 3 children.  Review of systems: Otherwise negative  Physical exam: See the dictated history and physical done at time of admission. X.  Hospital course:  The patient was [facility] in [facility] she underwent cardiac catheterization on 08/03/2011. She was found to have severe multivessel coronary artery disease. For full details please see the cardiac catheterization report. The patient was felt to be a candidate for surgical revascularization. Dr. Tressie Stalker was consulted. It was noted following cardiac catheterization that the patient did have some hemoptysis. He felt as though the patient should have a CT scan due to this prior to proceeding with surgery. This was negative. The hemoptysis resolved. Preoperatively the patient was found to have significant right carotid artery stenosis. Dr. [name] saw the patient. He has seen her previously for other vascular issues. She will need carotid endarterectomy in the near future it was felt that she should proceed with cardiac bypass prior to this.  Procedure: On 08/05/2011 she was taken to the operating room where she underwent the following procedure. Coronary artery bypass grafting x3 the following grafts were placed: #1 left internal mammary artery to the LAD #2 saphenous vein graft to the obtuse marginal #3 saphenous vein graft to the posterior descending coronary artery. The patient tolerated the procedure well. Intraoperatively she was found to have normal left ventricular function. She required open technique to bilateral legs to obtain vein. The vein  was poor quality. There in sternal mammary artery was good quality.  Postoperative hospital course: The patient's postoperative course has  been fairly unremarkable. She was extubated without difficulty. He has had some postoperative confusion which is improving with time. All routine lines monitors and drainage devices have been discontinued in the standard fashion. Pain has been controlled using usual protocols. She is advancing with steady progress with cardiac rehabilitation. Oxygen has been weaned and she maintains adequate saturations on room air. She is afebrile. Her incisions are healing without evidence of infection. She has had no significant cardiac dysrhythmias. Due to family situation she will require short-term nursing home placement for further rehabilitation. The bed surgeon is underway. Evidently she is felt to be stable for discharge in the next day or so from a clinical viewpoint.  Medications at discharge: Enteric-coated aspirin 325 mg by mouth daily ENSURE feeding supplement twice daily fenofibrate 160 mg daily Lisinopril 10 mg by mouth daily Lopressor 12.5 mg twice daily Crestor 20 mg by mouth daily  Final diagnoses Severe coronary artery disease Severe aorto occlusive arterial disease Abdominal aortic aneurysm Bilateral carotid artery stenosis Depression Anxiety Gastroesophageal reflux disease Tobacco abuse Status post coronary artery bypass grafting x3 Acute blood loss anemia, stable, hematocrit 27  Condition on discharge: Stable and improving  Instruction: Diet: Heart healthy Activity: Physical therapy with sternal precautions. Increase ambulation daily as tolerated. No lifting more than 10 pounds. Continue incentive spirometry. Incisions may be cleaned gently with soap and water.  Followup: The patient should see Dr. Cornelius Moras in 3 weeks. The patient should see Dr. Rennis Golden in 2 weeks.    Marland Kitchen

## 2011-08-11 NOTE — Progress Notes (Signed)
Pt's confusion seems to be increasing. During the shift she has asked if I could hear her cat and if I had saw her dog. In report it was passed that she tries to get out the of the bed on her own, she also did this on my shift. Pt needs constant prompting to walk during ambulation as well as when taking medications. These behaviors are new per report of her son, who is very concerned that her cognition has decreased along with the increased confusion. Judie Grieve (son) would like for you to call him [phone].   Carlyle Lipa, RN

## 2011-08-11 NOTE — Plan of Care (Signed)
Problem: Phase III Progression Outcomes Goal: Discharge plan remains appropriate-arrangements made Outcome: Adequate for Discharge Pt planned d/c to snf

## 2011-08-11 NOTE — Progress Notes (Addendum)
   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  Cardiac rehab reports that she sometimes needs to be prompted to continue to walking, has difficulty gripping walker with right hand, and fatigues easily.  Son is concerned about confusion etc. Patient is alert and oriented this am.  Objective: Vital signs in last 24 hours: Patient Vitals for the past 24 hrs:  BP Temp Temp src Pulse Resp SpO2 Weight  08/11/11 0520 123/61 mmHg 99 F (37.2 C) Oral 84  18  93 % 121 lb 7.6 oz (55.1 kg)  08/10/11 2259 158/60 mmHg - - 84  - - -  08/10/11 1900 128/65 mmHg 98.3 F (36.8 C) Oral 86  16  91 % -  08/10/11 1300 106/70 mmHg 98 F (36.7 C) Oral 77  18  93 % -     Hemodynamic parameters for last 24 hours:    Intake/Output from previous day: 11/06 0701 - 11/07 0700 In: 723 [P.O.:720; I.V.:3] Out: 100 [Urine:100]     Physical Exam:  Cardiovascular: RRR, no murmurs, gallops, or rubs. Pulmonary: Clear to auscultation bilaterally;no rales, wheezes, or rhonchi. Abdomen: Soft, non tender, bowel sounds present. Extremities: Trace bilateral lower extremity edema. Wounds: Clean and dry.  No erythema or signs of infection. Neurological:  Grossly intact without focal deficits.  Her right hand grip may be slightly weaker than her left.  Lab Results: CBC: Basename 08/09/11 0645  WBC 12.0*  HGB 9.1*  HCT 28.4*  PLT 234   BMET:  Basename 08/09/11 1619  NA 137  K 3.4*  CL 101  CO2 26  GLUCOSE 112*  BUN 22  CREATININE 0.80  CALCIUM 8.9    PT/INR: No results found for this basename: LABPROT,INR in the last 72 hours ABG:  INR: Will add last result for INR, ABG once components are confirmed Will add last 4 CBG results once components are confirmed  Assessment/Plan:  1. CV - SR. Stable. 2.  Pulmonary - Stable. 3. Patient is not confused this am although nursing staff reports with occasional confusion.  She has no focal deficit  appreciated on physical exam; however, her right hand grip strength is slightly weaker than the left.  Dr. Cornelius Moras to evaluate. 4.  ABL anemia - H/H stable.  Continue Nu Iron. 5.  Patient with loose stools. Will stop scheduled stool softeners. Ardelle Balls, PA 08/11/2011  I have seen and examined the patient and agree with the assessment and plan as outlined. I discussed the patient's condition and progress with her son via telephone.  She has improved substantially over the past 2 days.  She still has intermittent mild confusion/delerium, but this is much improved.  She is very weak and malnourished.  There are no findings suggestive of acute stroke, and there are no new therapeutic measures which might alter her progress even if she had suffered a perioperative stroke.  She needs nutritional support and physical therapy.  She potentially could be ready for d/c to SNF within 1-2 days if she continues to improve.  She will need to be seen in the office by one of the PA's to check her thigh incision and remove sutures in 2 weeks.  Will recheck labs in am, including LFT's and protein levels.  She looks a bit jaundiced.

## 2011-08-12 LAB — COMPREHENSIVE METABOLIC PANEL
ALT: 11 U/L (ref 0–35)
AST: 26 U/L (ref 0–37)
Alkaline Phosphatase: 98 U/L (ref 39–117)
CO2: 24 mEq/L (ref 19–32)
Calcium: 9.5 mg/dL (ref 8.4–10.5)
GFR calc non Af Amer: >90 mL/min (ref >90)
Potassium: 3.7 mEq/L (ref 3.5–5.1)
Sodium: 138 mEq/L (ref 135–145)
Total Protein: 7.3 g/dL (ref 6.0–8.3)

## 2011-08-12 LAB — CBC
MCH: 27.9 pg (ref 26.0–34.0)
Platelets: 362 10*3/uL (ref 150–400)
RBC: 3.26 MIL/uL — ABNORMAL LOW (ref 3.87–5.11)

## 2011-08-12 MED ORDER — ASPIRIN 325 MG PO TBEC: 325.0000 mg | DELAYED_RELEASE_TABLET | Freq: Every day | ORAL | Status: DC

## 2011-08-12 MED ORDER — POTASSIUM CHLORIDE CRYS ER 10 MEQ PO TBCR
30.0000 meq | EXTENDED_RELEASE_TABLET | Freq: Once | ORAL | Status: AC
  Administered 2011-08-12: 30 meq via ORAL
  Filled 2011-08-12: qty 1

## 2011-08-12 NOTE — Progress Notes (Signed)
Cardiac Rehab  [phone] Education on IS, sternal precautions, and ex ed with pt and son. Stressed that pt must have asst  When up. Permission given to refer pt to [facility]. Masie Bermingham DunlapRN

## 2011-08-12 NOTE — Progress Notes (Signed)
Subjective: No specific complaints, no chest pain.  Objective: Vital signs in last 24 hours: Temp:  [97.4 F (36.3 C)-99.3 F (37.4 C)] 97.4 F (36.3 C) (11/08 0643) Pulse Rate:  [65-99] 99  (11/08 0643) Resp:  [18] 18  (11/08 0643) BP: (116-145)/(45-64) 134/57 mmHg (11/08 0643) SpO2:  [93 %-96 %] 96 % (11/08 0643) Weight:  [58.922 kg (129 lb 14.4 oz)] 129 lb 14.4 oz (58.922 kg) (11/08 0643) Weight change: 3.822 kg (8 lb 6.8 oz) Last BM Date: 08/12/11 Intake/Output from previous day: 11/07 0701 - 11/08 0700 In: 123 [P.O.:120; I.V.:3] Out: -  Intake/Output this shift:    PE: Heart:  S1S2, RRR. LUNGS:   Clear without rales, rhonchi or wheezes. ABD:  Soft no-tender. Pos. Bowel sounds. Ext: no edema. Lab Results:  [facility] 08/12/11 0615  WBC 12.0*  HGB 9.1*  HCT 29.0*  PLT 362   BMET  Basename 08/12/11 0615 08/09/11 1619  NA 138 137  K 3.7 3.4*  CL 102 101  CO2 24 26  GLUCOSE 103* 112*  BUN 13 22  CREATININE 0.76 0.80  CALCIUM 9.5 8.9   No results found for this basename: TROPONINI:2,CK,MB:2 in the last 72 hours  Lab Results  Component Value Date   CHOL 359* 08/03/2011   HDL 27* 08/03/2011   LDLCALC UNABLE TO CALCULATE IF TRIGLYCERIDE OVER 400 mg/dL 11/91/4782   TRIG 956* 08/03/2011   CHOLHDL 13.3 08/03/2011   Lab Results  Component Value Date   HGBA1C 5.8* 08/04/2011     Lab Results  Component Value Date   TSH 2.942 08/03/2011    Hepatic Function Panel  Basename 08/12/11 0615  PROT 7.3  ALBUMIN 2.9*  AST 26  ALT 11  ALKPHOS 98  BILITOT 0.6  BILIDIR --  IBILI --   No results found for this basename: CHOL in the last 72 hours No results found for this basename: PROTIME in the last 72 hours    EKG: Orders placed during the hospital encounter of 08/03/11  . EKG  . EKG  . EKG  . EKG  . EKG    Studies/Results: No results found.  Medications: I have reviewed the patient's current medications.  Assessment/Plan: Patient Active  Problem List  Diagnoses  . PAD (peripheral artery disease)  . Claudication  . AAA (abdominal aortic aneurysm)  . CAD (coronary artery disease)  . S/P CABG x 3  . Carotid stenosis, bilateral  . Depression  . Fatigue  . GERD (gastroesophageal reflux disease)  . Anxiety  . Tobacco abuse   PLAN: POD #7 doing well, SR.  Plan to d/c to SNF,      LOS: 9 days   INGOLD,LAURA R 08/12/2011, 10:08 AM  Pt is POD #7. Looks great NSR. Labs OK. Exam benign. [facility]. Plan per [facility].  Agree with note written by Nada Boozer RNP Nanetta Batty J 08/12/2011 10:12 AM

## 2011-08-12 NOTE — Progress Notes (Signed)
Pt ambulated 100 feet on rm air and tolerated activity well.  [name], [place]

## 2011-08-12 NOTE — Progress Notes (Addendum)
   Subjective: Patient is alert this am.  She knows her birthdate, but not the day and year.  Patient has had intermittent confusion postoperatively.Her only complaint is being thirsty.    Objective: Vital signs in last 24 hours: Patient Vitals for the past 24 hrs:  BP Temp Temp src Pulse Resp SpO2 Weight  08/12/11 0643 134/57 mmHg 97.4 F (36.3 C) Oral 99  18  96 % 129 lb 14.4 oz (58.922 kg)  08/11/11 2308 132/64 mmHg - - - - - -  08/11/11 2100 116/62 mmHg 99.3 F (37.4 C) Oral 65  - 93 % -  08/11/11 1352 145/45 mmHg 98.8 F (37.1 C) Oral 82  18  94 % -        Intake/Output from previous day: 11/07 0701 - 11/08 0700 In: 123 [P.O.:120; I.V.:3] Out: -      Physical Exam:  Cardiovascular: RRR, no murmurs, gallops, or rubs. Pulmonary: Clear to auscultation bilaterally;no rales, wheezes, or rhonchi. Abdomen: Soft, non tender, bowel sounds present. Extremities:Trace bilateral lower extremity edema. Wounds: Clean and dry.  No erythema or signs of infection.  Slight erythema distal sternal incision and no drainage. Neurologic:Grossly intact without any focal deficits.  Lab Results: CBC: Basename 08/12/11 0615  WBC 12.0*  HGB 9.1*  HCT 29.0*  PLT 362   BMET:  Basename 08/09/11 1619  NA 137  K 3.4*  CL 101  CO2 26  GLUCOSE 112*  BUN 22  CREATININE 0.80  CALCIUM 8.9    PT/INR: No results found for this basename: LABPROT,INR in the last 72 hours ABG:  INR: Will add last result for INR, ABG once components are confirmed Will add last 4 CBG results once components are confirmed  Assessment/Plan:  1. CV - Maintaining SR on current medications (Lopressor and Lisinopril). 2.  Pulmonary - Stable. 3.  ABL anemia - Stable. 4.Remove chest  sutures today. 5. Regarding patient's mentation, she has had intermittent confusion.  She is not on any narcotics and her Zoloft was discontinued days ago.  She has no focal deficits on neuro exam.   6.Will supplement KCl as K+ is  3.7 today. 7.LFTs within normal limits. Ordered as patient appears slightly jaundiced. 8.  Will need SNF upon discharge.  Ardelle Balls, PA 08/12/2011

## 2011-08-12 NOTE — Progress Notes (Signed)
Pt and son provided with discharge instructions, including medications. Understanding was verbalized, there were no questions regarding care. Pt will leave via wheelchair to SNF, with son.  Katy Fitch Brannan 1:37 PM 08/12/2011

## 2011-08-12 NOTE — Op Note (Signed)
  NAMEMANIAH, NADING NO.:  [id number]  MEDICAL RECORD NO.:  [id number]  LOCATION:  2303                         FACILITY:  [facility]  PHYSICIAN:  Bedelia Person, M.D.        DATE OF BIRTH:  1956/01/03  DATE OF PROCEDURE:  08/05/2011 DATE OF DISCHARGE:                              OPERATIVE REPORT   [name] is a 55 year old female with significant coronary  artery disease, scheduled for coronary artery bypass grafting.  She has had a history of congestive heart failure in the past.  The transesophageal echocardiography will be used intraoperatively to assess left ventricular function as well as valvular function both pre and postbypass.  After induction with general anesthesia, the area was secured with an oral endotracheal tube.  The transesophageal transducer was heavily lubricated and placed blindly down the esophagus with no significant resistance.  The patient had no history of esophageal or gastric pathology.  The prebypass examination revealed left ventricle to show global hypokinesis.  There were no segmental defect.  Left atrium was normal size.  The appendage was clean.  Interatrial septum was intact using color Doppler.  The mitral valve exhibited two leaflets, which showed no masses of vegetations.  They appear to be coapting well in the valvular plane.  Color Doppler reveals a trace central mitral regurg. Aortic valve had three leaflets.  There was no significant calcification.  All leaflets opened and closed appropriately.  Color Doppler reveals a trace central aortic insufficiency.  The patient was placed on cardiac pulmonary bypass and underwent coronary artery bypassing past grafting at the completion of the bypass.  There were a few air bubbles, which were noted and quickly cleared.  The examination then revealed the left ventricle to show improved contractility.  No external inotropes were being administered.  There were new segmental defect  detected.  The left atrium again was normal size.  The mitral valve was unchanged from the prebypass examination continuing to show just a trace-to-mild central regurgitant flow.  Aortic valve continued to show trace aortic insufficiency, but no other defects.          ______________________________ Bedelia Person, M.D.     LK/MEDQ  D:  08/05/2011  T:  08/06/2011  Job:  [id number]  Electronically Signed by Bedelia Person M.D. on 08/12/2011 12:36:06 PM

## 2011-08-12 NOTE — Progress Notes (Signed)
CSW handed packet to pt son, who is transporting pt by car to [facility] at present. CSW signing off.

## 2011-08-12 NOTE — Progress Notes (Signed)
Four chest tube sutures were removed this am per protocol. Steri strips were placed over incision. Pt reported no pain.  Katy Fitch [facility] 08/12/2011

## 2011-08-20 ENCOUNTER — Encounter: Payer: Self-pay | Admitting: *Deleted

## 2011-08-23 ENCOUNTER — Ambulatory Visit: Payer: Self-pay

## 2011-08-23 VITALS — BP 142/96 | HR 104 | Resp 16

## 2011-08-23 DIAGNOSIS — I2510 Atherosclerotic heart disease of native coronary artery without angina pectoris: Secondary | ICD-10-CM

## 2011-08-23 NOTE — Patient Instructions (Signed)
Instructed on monitoring  for signs of infection. She is to return to SNF

## 2011-08-23 NOTE — Progress Notes (Signed)
  S/p recent Cabg brought into office for suture removal from open saphenous vein harvest. Sutures removed without problem. Incisions healing well. RTC on Nov 29 for routine post op visit.  Gershon Crane PA-C

## 2011-08-24 ENCOUNTER — Other Ambulatory Visit: Payer: Self-pay | Admitting: Thoracic Surgery (Cardiothoracic Vascular Surgery)

## 2011-08-24 DIAGNOSIS — I2510 Atherosclerotic heart disease of native coronary artery without angina pectoris: Secondary | ICD-10-CM

## 2011-08-30 ENCOUNTER — Ambulatory Visit (INDEPENDENT_AMBULATORY_CARE_PROVIDER_SITE_OTHER): Payer: Self-pay | Admitting: Thoracic Surgery (Cardiothoracic Vascular Surgery)

## 2011-08-30 ENCOUNTER — Encounter: Payer: Self-pay | Admitting: Thoracic Surgery (Cardiothoracic Vascular Surgery)

## 2011-08-30 ENCOUNTER — Ambulatory Visit
Admission: RE | Admit: 2011-08-30 | Discharge: 2011-08-30 | Disposition: A | Discharge status: Home / Self Care | Payer: No Typology Code available for payment source | Source: Ambulatory Visit | Attending: Thoracic Surgery (Cardiothoracic Vascular Surgery) | Admitting: Thoracic Surgery (Cardiothoracic Vascular Surgery)

## 2011-08-30 VITALS — BP 119/62 | HR 71 | Resp 16 | Ht 64.0 in | Wt 129.0 lb

## 2011-08-30 DIAGNOSIS — I2510 Atherosclerotic heart disease of native coronary artery without angina pectoris: Secondary | ICD-10-CM

## 2011-08-30 DIAGNOSIS — Z951 Presence of aortocoronary bypass graft: Secondary | ICD-10-CM

## 2011-08-30 NOTE — Progress Notes (Signed)
[address]            [place] [address]          [phone]     CARDIOTHORACIC SURGERY OFFICE NOTE  Referring Provider is Rennis Golden Lisette Abu., MD PCP is Lonia Blood, MD   HPI:  Patient returns for follow-up having undergone CABG x3 on 08/05/2011.  Preoperatively she was found to have severe extracranial cerebrovascular disease and she was seen in consultation by Dr. Hart Rochester who follows her for her severe aortoiliac occlusive disease.  Postoperatively the patient has done reasonably well under the circumstances. She was discharged to a skilled nursing facility where she remains at this time. She was brought to the office by her son today. She has not yet been seen in followup by her cardiologist.   Current Outpatient Prescriptions  Medication Sig Dispense Refill  . aspirin 325 MG EC tablet Take 1 tablet (325 mg total) by mouth daily.  30 tablet  1  . buPROPion (WELLBUTRIN SR) 150 MG 12 hr tablet Take 150 mg by mouth 2 (two) times daily.        . fenofibrate 160 MG tablet Take 1 tablet (160 mg total) by mouth daily.  30 tablet  1  . lisinopril (PRINIVIL,ZESTRIL) 10 MG tablet Take 1 tablet (10 mg total) by mouth daily.  30 tablet  1  . metoprolol tartrate (LOPRESSOR) 12.5 mg TABS Take 0.5 tablets (12.5 mg total) by mouth 2 (two) times daily.  30 tablet  1  . nicotine (NICODERM CQ - DOSED IN MG/24 HOURS) 21 mg/24hr patch Place 1 patch onto the skin daily.        Marland Kitchen omeprazole (PRILOSEC) 40 MG capsule Take 40 mg by mouth daily.        . polysaccharide iron (NIFEREX) 150 MG CAPS capsule Take 1 capsule (150 mg total) by mouth daily.  30 each  1  . rosuvastatin (CRESTOR) 20 MG tablet Take 1 tablet (20 mg total) by mouth daily at 6 PM.  30 tablet  1  . traMADol (ULTRAM) 50 MG tablet Take 50 mg by mouth every 6 (six) hours as needed. Maximum dose= 8 tablets per day           Physical Exam:   BP 119/62  Pulse 71  Resp 16  Ht 5\' 4"  (1.626 m)  Wt 129 lb  (58.514 kg)  BMI 22.14 kg/m2  SpO2 98%  On physical exam the patient looks reasonably good. Breath sounds are clear to auscultation and symmetrical bilaterally. The sternal incision is healing nicely with exception of a dry eschar located at the inferior aspect of the sternal incision. This is gently debrided and cleansed with saline. There is no sign of any sort of wound infection but simply an area of very mild skin edge necrosis. The sternum is stable on palpation. Cardiovascular exam is notable for regular rate and rhythm. The abdomen is soft nontender. Extremities warm and well-perfused. There is no lower extremity edema. Incisions from bilateral thigh vein harvest were also he is healing reasonably well with exception of some dry eschar in the right side. The middle portion of this eschar is debrided gently as well. Again there is no sign of infection. The remainder of her physical exam is unremarkable.  Diagnostic Tests:  Chest x-ray performed today demonstrates clear lung fields bilaterally. There are no pleural effusions. All the  sternal wires appear intact.  Impression:  Satisfactory progress following recent coronary artery bypass grafting x3. The patient has some mild skin edge necrosis involving the inferior aspect of her sternal incision as well as several portions of the right thigh incision. These were all gently debrided in the office today. She will need daily wound care to include cleansing of the wound with saline moistened gauze to applied to open areas. I think is reasonable that she might be able to return to living at home with her son within the next few weeks if she continues to progress.  Plan:  We will see the patient back in 4 weeks' time to make sure that all of her wounds are healing nicely. We will make arrangements for home health nursing as needed once she is to be discharged from the nursing facility where she is currently staying. We have not made any changes in her  current medications.    Salvatore Decent. Cornelius Moras, MD

## 2011-09-06 ENCOUNTER — Encounter: Payer: Self-pay | Admitting: Thoracic Surgery (Cardiothoracic Vascular Surgery)

## 2011-09-06 ENCOUNTER — Other Ambulatory Visit: Payer: Self-pay | Admitting: Thoracic Surgery (Cardiothoracic Vascular Surgery)

## 2011-09-06 ENCOUNTER — Inpatient Hospital Stay (HOSPITAL_COMMUNITY): Payer: Medicaid Other

## 2011-09-06 ENCOUNTER — Ambulatory Visit (INDEPENDENT_AMBULATORY_CARE_PROVIDER_SITE_OTHER): Payer: Self-pay | Admitting: Thoracic Surgery (Cardiothoracic Vascular Surgery)

## 2011-09-06 ENCOUNTER — Other Ambulatory Visit: Payer: Self-pay

## 2011-09-06 ENCOUNTER — Inpatient Hospital Stay (HOSPITAL_COMMUNITY)
Admission: AD | Admit: 2011-09-06 | Discharge: 2011-09-10 | DRG: 863 | Disposition: A | Discharge status: Home / Self Care | Payer: Medicaid Other | Source: Ambulatory Visit | Attending: Thoracic Surgery (Cardiothoracic Vascular Surgery) | Admitting: Thoracic Surgery (Cardiothoracic Vascular Surgery)

## 2011-09-06 VITALS — BP 128/58 | HR 83 | Temp 98.1°F | Resp 16 | Ht 64.0 in | Wt 129.0 lb

## 2011-09-06 DIAGNOSIS — F411 Generalized anxiety disorder: Secondary | ICD-10-CM | POA: Diagnosis present

## 2011-09-06 DIAGNOSIS — I2510 Atherosclerotic heart disease of native coronary artery without angina pectoris: Secondary | ICD-10-CM | POA: Diagnosis present

## 2011-09-06 DIAGNOSIS — Z951 Presence of aortocoronary bypass graft: Secondary | ICD-10-CM | POA: Insufficient documentation

## 2011-09-06 DIAGNOSIS — M47817 Spondylosis without myelopathy or radiculopathy, lumbosacral region: Secondary | ICD-10-CM | POA: Diagnosis present

## 2011-09-06 DIAGNOSIS — A4902 Methicillin resistant Staphylococcus aureus infection, unspecified site: Secondary | ICD-10-CM | POA: Diagnosis present

## 2011-09-06 DIAGNOSIS — T8130XA Disruption of wound, unspecified, initial encounter: Secondary | ICD-10-CM | POA: Diagnosis present

## 2011-09-06 DIAGNOSIS — L988 Other specified disorders of the skin and subcutaneous tissue: Secondary | ICD-10-CM

## 2011-09-06 DIAGNOSIS — Y832 Surgical operation with anastomosis, bypass or graft as the cause of abnormal reaction of the patient, or of later complication, without mention of misadventure at the time of the procedure: Secondary | ICD-10-CM | POA: Diagnosis present

## 2011-09-06 DIAGNOSIS — I9789 Other postprocedural complications and disorders of the circulatory system, not elsewhere classified: Secondary | ICD-10-CM

## 2011-09-06 DIAGNOSIS — T8140XA Infection following a procedure, unspecified, initial encounter: Principal | ICD-10-CM | POA: Diagnosis present

## 2011-09-06 DIAGNOSIS — F329 Major depressive disorder, single episode, unspecified: Secondary | ICD-10-CM | POA: Diagnosis present

## 2011-09-06 DIAGNOSIS — IMO0002 Reserved for concepts with insufficient information to code with codable children: Secondary | ICD-10-CM

## 2011-09-06 DIAGNOSIS — K219 Gastro-esophageal reflux disease without esophagitis: Secondary | ICD-10-CM | POA: Diagnosis present

## 2011-09-06 DIAGNOSIS — Z7982 Long term (current) use of aspirin: Secondary | ICD-10-CM

## 2011-09-06 DIAGNOSIS — T8189XA Other complications of procedures, not elsewhere classified, initial encounter: Secondary | ICD-10-CM | POA: Diagnosis present

## 2011-09-06 DIAGNOSIS — F3289 Other specified depressive episodes: Secondary | ICD-10-CM | POA: Diagnosis present

## 2011-09-06 MED ORDER — ASPIRIN EC 325 MG PO TBEC
325.0000 mg | DELAYED_RELEASE_TABLET | Freq: Every day | ORAL | Status: DC
  Administered 2011-09-07 – 2011-09-10 (×4): 325 mg via ORAL
  Filled 2011-09-06 (×4): qty 1

## 2011-09-06 MED ORDER — ASPIRIN 81 MG PO TBEC: 325.0000 mg | DELAYED_RELEASE_TABLET | Freq: Every day | ORAL | Status: DC

## 2011-09-06 MED ORDER — VANCOMYCIN HCL 1000 MG IV SOLR
1500.0000 mg | INTRAVENOUS | Status: DC
  Administered 2011-09-06 – 2011-09-09 (×4): 1500 mg via INTRAVENOUS
  Filled 2011-09-06 (×6): qty 1500

## 2011-09-06 MED ORDER — ACETAMINOPHEN 325 MG PO TABS: 650.0000 mg | ORAL_TABLET | Freq: Four times a day (QID) | ORAL | Status: DC | PRN

## 2011-09-06 MED ORDER — FENOFIBRATE 160 MG PO TABS
160.0000 mg | ORAL_TABLET | Freq: Every day | ORAL | Status: DC
  Administered 2011-09-06 – 2011-09-10 (×5): 160 mg via ORAL
  Filled 2011-09-06 (×5): qty 1

## 2011-09-06 MED ORDER — METOPROLOL TARTRATE 12.5 MG HALF TABLET
12.5000 mg | ORAL_TABLET | Freq: Two times a day (BID) | ORAL | Status: DC
  Administered 2011-09-06 – 2011-09-10 (×8): 12.5 mg via ORAL
  Filled 2011-09-06 (×9): qty 1

## 2011-09-06 MED ORDER — BUPROPION HCL ER (XL) 150 MG PO TB24
150.0000 mg | ORAL_TABLET | Freq: Every day | ORAL | Status: DC
  Administered 2011-09-06 – 2011-09-10 (×5): 150 mg via ORAL
  Filled 2011-09-06 (×5): qty 1

## 2011-09-06 MED ORDER — LISINOPRIL 10 MG PO TABS
10.0000 mg | ORAL_TABLET | Freq: Every day | ORAL | Status: DC
  Administered 2011-09-07 – 2011-09-10 (×4): 10 mg via ORAL
  Filled 2011-09-06 (×4): qty 1

## 2011-09-06 MED ORDER — ROSUVASTATIN CALCIUM 20 MG PO TABS
20.0000 mg | ORAL_TABLET | Freq: Every day | ORAL | Status: DC
  Administered 2011-09-06 – 2011-09-09 (×4): 20 mg via ORAL
  Filled 2011-09-06 (×5): qty 1

## 2011-09-06 MED ORDER — PANTOPRAZOLE SODIUM 40 MG PO TBEC
40.0000 mg | DELAYED_RELEASE_TABLET | Freq: Every day | ORAL | Status: DC
  Administered 2011-09-06 – 2011-09-09 (×4): 40 mg via ORAL
  Filled 2011-09-06 (×5): qty 1

## 2011-09-06 MED ORDER — CIPROFLOXACIN HCL 500 MG PO TABS
500.0000 mg | ORAL_TABLET | Freq: Two times a day (BID) | ORAL | Status: DC
  Administered 2011-09-06 – 2011-09-08 (×4): 500 mg via ORAL
  Filled 2011-09-06 (×6): qty 1

## 2011-09-06 MED ORDER — TRAMADOL HCL 50 MG PO TABS
100.0000 mg | ORAL_TABLET | Freq: Four times a day (QID) | ORAL | Status: DC | PRN
  Administered 2011-09-06 – 2011-09-08 (×6): 100 mg via ORAL
  Filled 2011-09-06 (×6): qty 2

## 2011-09-06 NOTE — H&P (Signed)
[address]            [place] [address]          [phone]     CARDIOTHORACIC SURGERY ADMISSION HISTORY AND PHYSICAL EXAM  Referring Provider is Runell Gess, MD  PCP is Lonia Blood, MD    HPI:  Patient returns for follow-up having undergone CABG x3 on 08/05/2011. She was just seen in the office last week at which time she was noted to have some skin edge necrosis and poor healing at the inferior aspect of her sternal incision and along the mid part of her right thigh saphenous vein harvest incision. These areas were debrided sharply in the office and local wound care was prescribed to be performed at the skilled nursing facility where the patient has been staying. The patient was brought back for office today for wound check her son was concerned about the fact that she apparently has not been receiving daily wound care. The patient has developed some drainage from the him for aspect of her sternal incision and the family is concerned that the instructions for wound packing was not being performed. The patient states that she otherwise feels well she denies any fevers or chills. She does have some persistent pain in her chest.    Past Medical History  Diagnosis Date  . Aortic aneurysm   . Depression   . Anxiety   . Acid reflux   . Claudication   . DJD (degenerative joint disease) of lumbar spine   . Coronary artery disease     Past Surgical History  Procedure Date  . Cholecystectomy   . Abdominal hysterectomy   . Spine surgery 2000 & 2002    lumbar laminectiomies  . Aortogram with runoff 04/27/2011    Bilateral runoff by Dr. Myra Gianotti  . Coronary artery bypass graft 08/05/2011    CABG x3 using LIMA to D1, SVG to OM, SVG to PDA, open vein harvest bilateral thighs    Family History  Problem Relation Age of Onset  . Diabetes Mother   . Diabetes Sister     Social History History  Substance Use Topics  . Smoking status: Former  Smoker -- 0.5 packs/day for 30 years    Types: Cigarettes    Quit date: 08/05/2011  . Smokeless tobacco: Not on file  . Alcohol Use: No    Current Outpatient Prescriptions   Medication  Sig  Dispense  Refill   .  aspirin 325 MG EC tablet  Take 1 tablet (325 mg total) by mouth daily.  30 tablet  1   .  buPROPion (WELLBUTRIN SR) 150 MG 12 hr tablet  Take 150 mg by mouth 2 (two) times daily.     .  fenofibrate 160 MG tablet  Take 1 tablet (160 mg total) by mouth daily.  30 tablet  1   .  lisinopril (PRINIVIL,ZESTRIL) 10 MG tablet  Take 1 tablet (10 mg total) by mouth daily.  30 tablet  1   .  metoprolol tartrate (LOPRESSOR) 12.5 mg TABS  Take 0.5 tablets (12.5 mg total) by mouth 2 (two) times daily.  30 tablet  1   .  nicotine (NICODERM CQ - DOSED IN MG/24 HOURS) 21 mg/24hr patch  Place 1 patch onto the skin daily.     Marland Kitchen  omeprazole (PRILOSEC) 40 MG capsule  Take 40 mg by  mouth daily.     .  polysaccharide iron (NIFEREX) 150 MG CAPS capsule  Take 1 capsule (150 mg total) by mouth daily.  30 each  1   .  rosuvastatin (CRESTOR) 20 MG tablet  Take 1 tablet (20 mg total) by mouth daily at 6 PM.  30 tablet  1   .  traMADol (ULTRAM) 50 MG tablet  Take 50 mg by mouth every 6 (six) hours as needed. Maximum dose= 8 tablets per day       Review of Systems:  General:  normal appetite, normal energy   Respiratory:  no cough, no wheezing, no hemoptysis, no pain with inspiration or cough, no shortness of breath   Cardiac:  mild chest pain with movement and cough, not with exertion, no exertional SOB, no resting SOB, no PND, no orthopnea, no LE edema, no palpitations, no syncope  GI:   no difficulty swallowing, no hematochezia, no hematemesis, no melena, no constipation, no diarrhea   GU:   no dysuria, no urgency, no frequency   Musculoskeletal: no arthritis, no arthralgia   Vascular:  no pain suggestive of claudication   Neuro:   no symptoms suggestive of TIA's, no seizures, no headaches, no peripheral  neuropathy   Endocrine:  Negative   HEENT:  no loose teeth or painful teeth,  no recent vision changes  Psych:   no anxiety, no depression    Physical Exam:   BP 124/78  Pulse 87  Temp(Src) 97.8 F (36.6 C) (Oral)  Resp 16  Ht 5\' 4"  (1.626 m)  Wt 123 lb 0.3 oz (55.8 kg)  BMI 21.12 kg/m2  SpO2 98%  General:    well-appearing  HEENT:  Unremarkable   Neck:   no JVD, no bruits, no adenopathy   Chest:   clear to auscultation, symmetrical breath sounds, no wheezes, no rhonchi, the inferior aspect of the patient's sternal incision is separated and there is no packing in the wound. There is no purulent drainage but there is fat necrosis and debris in the wound that has not been cleansed. The wound is sharply debrided in the office there appears to be healthy tissue beneath. The sternum is stable on palpation. There is no surrounding cellulitis.  CV:   RRR, no  murmur   Abdomen:  soft, non-tender, no masses   Extremities:  warm, well-perfused, pulses not palpable, The right thigh wound has some fat necrosis but no purulence nor surrounding cellulitie. The wound is debris sharply. There is no LE edema  Rectal/GU  Deferred  Neuro:   Grossly non-focal and symmetrical throughout  Skin:   Clean and dry, no rashes, no breakdown    Impression:   Nonhealing of surgical wounds with skin edge necrosis and fat necrosis involving the inferior aspect of the sternal incision. The patient apparently has not been receiving the prescribed wound care and there is now some drainage from the sternal incision.   Plan:   We will admit the patient to the hospital for intravenous antibiotics, local wound care, and chest CT scan. She will need wound VAC placement. If the CT scan looks favorable and there is no sign of deep sternal wound infection, is possible that the patient could be discharged within the week for continued care at home with home health care. The patient's son and family are very supportive and  would be agreeable to making arrangements for further home care as needed.    Salvatore Decent. Cornelius Moras, MD

## 2011-09-06 NOTE — Progress Notes (Signed)
ANTIBIOTIC CONSULT NOTE - INITIAL  Pharmacy Consult for vancomycin Indication: nonhealing sternal wounds  Allergies  Allergen Reactions  . Celebrex (Celecoxib) Swelling  . Eggs Or Egg-Derived Products     REACTION: tolerates flu vaccine, though    Patient Measurements: Height: 5\' 4"  (162.6 cm) Weight: 123 lb 0.3 oz (55.8 kg) IBW/kg (Calculated) : 54.7    Vital Signs: Temp: 97.8 F (36.6 C) (12/03 1727) Temp src: Oral (12/03 1727) BP: 124/78 mmHg (12/03 1727) Pulse Rate: 87  (12/03 1727)    Labs: Estimated Creatinine Clearance: 67.8 ml/min (by C-G formula based on Cr of 0.76). Microbiology: No results found for this or any previous visit (from the past 720 hour(s)).  Medical History: Past Medical History  Diagnosis Date  . Aortic aneurysm   . Depression   . Anxiety   . Acid reflux   . Claudication   . DJD (degenerative joint disease) of lumbar spine   . Coronary artery disease     Medications:  Prescriptions prior to admission  Medication Sig Dispense Refill  . aspirin 325 MG EC tablet Take 1 tablet (325 mg total) by mouth daily.  30 tablet  1  . buPROPion (WELLBUTRIN SR) 150 MG 12 hr tablet Take 150 mg by mouth 2 (two) times daily.        . fenofibrate 160 MG tablet Take 1 tablet (160 mg total) by mouth daily.  30 tablet  1  . lisinopril (PRINIVIL,ZESTRIL) 10 MG tablet Take 1 tablet (10 mg total) by mouth daily.  30 tablet  1  . metoprolol tartrate (LOPRESSOR) 12.5 mg TABS Take 0.5 tablets (12.5 mg total) by mouth 2 (two) times daily.  30 tablet  1  . nicotine (NICODERM CQ - DOSED IN MG/24 HOURS) 21 mg/24hr patch Place 1 patch onto the skin daily.        Marland Kitchen omeprazole (PRILOSEC) 40 MG capsule Take 40 mg by mouth daily.        . polysaccharide iron (NIFEREX) 150 MG CAPS capsule Take 1 capsule (150 mg total) by mouth daily.  30 each  1  . rosuvastatin (CRESTOR) 20 MG tablet Take 1 tablet (20 mg total) by mouth daily at 6 PM.  30 tablet  1  . traMADol (ULTRAM) 50  MG tablet Take 50 mg by mouth every 6 (six) hours as needed. For pain. Maximum dose= 8 tablets per day       Assessment: 55 yo F readmitted with nonhealing sternal wounds. Afebrile.  Creat clearance ~ 68 ml/min. Wt 55.8 kg  Goal of Therapy:  Vancomycin trough level 10-15 mcg/ml  Plan:  Vancomycin 1500mg  IV q24; she will also receive cipro 500mg  po bid.  Len Childs T 09/06/2011,5:42 PM

## 2011-09-06 NOTE — Progress Notes (Signed)
[address]            [name] [address]          [phone]     CARDIOTHORACIC SURGERY OFFICE NOTE  Referring Provider is Runell Gess, MD PCP is Lonia Blood, MD   HPI:  Patient returns for follow-up having undergone CABG x3 on 08/05/2011.  She was just seen in the office last week at which time she was noted to have some skin edge necrosis and poor healing at the inferior aspect of her sternal incision and along the mid part of her right thigh saphenous vein harvest incision. These areas were debrided sharply in the office and local wound care was prescribed to be performed at the skilled nursing facility where the patient has been staying. The patient was brought back for office today for wound check her son was concerned about the fact that she apparently has not been receiving daily wound care. The patient has developed some drainage from the him for aspect of her sternal incision and the family is concerned that the instructions for wound packing was not being performed. The patient states that she otherwise feels well she denies any fevers or chills. She does have some persistent pain in her chest.   Current Outpatient Prescriptions  Medication Sig Dispense Refill  . aspirin 325 MG EC tablet Take 1 tablet (325 mg total) by mouth daily.  30 tablet  1  . buPROPion (WELLBUTRIN SR) 150 MG 12 hr tablet Take 150 mg by mouth 2 (two) times daily.        . fenofibrate 160 MG tablet Take 1 tablet (160 mg total) by mouth daily.  30 tablet  1  . lisinopril (PRINIVIL,ZESTRIL) 10 MG tablet Take 1 tablet (10 mg total) by mouth daily.  30 tablet  1  . metoprolol tartrate (LOPRESSOR) 12.5 mg TABS Take 0.5 tablets (12.5 mg total) by mouth 2 (two) times daily.  30 tablet  1  . nicotine (NICODERM CQ - DOSED IN MG/24 HOURS) 21 mg/24hr patch Place 1 patch onto the skin daily.        Marland Kitchen omeprazole (PRILOSEC) 40 MG capsule Take 40 mg by mouth daily.        .  polysaccharide iron (NIFEREX) 150 MG CAPS capsule Take 1 capsule (150 mg total) by mouth daily.  30 each  1  . rosuvastatin (CRESTOR) 20 MG tablet Take 1 tablet (20 mg total) by mouth daily at 6 PM.  30 tablet  1  . traMADol (ULTRAM) 50 MG tablet Take 50 mg by mouth every 6 (six) hours as needed. Maximum dose= 8 tablets per day           Physical Exam:   BP 128/58  Pulse 83  Temp 98.1 F (36.7 C)  Resp 16  Ht 5\' 4"  (1.626 m)  Wt 129 lb (58.514 kg)  BMI 22.14 kg/m2  SpO2 98%  On physical exam the inferior aspect of the patient's sternal incision is separated and there is no packing in the wound. There is no purulent drainage but there is fat necrosis and debris in the wound that has not been cleansed. The wound is sharply debrided in the office there appears to be healthy tissue beneath. The sternum is stable on palpation. There is no surrounding cellulitis. The right thigh wound does not look quite so bad but again  there is no packing in the wound. The wound is debris sharply. Both wounds are packed with saline moistened gauze.  Diagnostic Tests:  n/a  Impression:  Nonhealing of surgical wounds with skin edge necrosis and fat necrosis involving the inferior aspect of the sternal incision. The patient apparently has not been receiving the prescribed wound care and there is now some drainage from the sternal incision.  Plan:  We will admit the patient to the hospital for intravenous antibiotics, local wound care, and chest CT scan. She will need wound VAC placement. If the CT scan looks favorable and there is no sign of deep sternal wound infection, is possible that the patient could be discharged within the week for continued care at home with home health care. The patient's son and family are very supportive and would be agreeable to making arrangements for further home care as needed.    Salvatore Decent. Cornelius Moras, MD

## 2011-09-07 MED ORDER — MUPIROCIN 2 % EX OINT
1.0000 "application " | TOPICAL_OINTMENT | Freq: Two times a day (BID) | CUTANEOUS | Status: DC
  Administered 2011-09-07 – 2011-09-10 (×7): 1 via NASAL
  Filled 2011-09-07: qty 22

## 2011-09-07 MED ORDER — MUPIROCIN CALCIUM 2 % EX CREA
TOPICAL_CREAM | Freq: Two times a day (BID) | CUTANEOUS | Status: DC
  Administered 2011-09-07 – 2011-09-08 (×2): via TOPICAL
  Filled 2011-09-07: qty 15

## 2011-09-07 MED ORDER — ENOXAPARIN SODIUM 40 MG/0.4ML ~~LOC~~ SOLN
40.0000 mg | Freq: Every day | SUBCUTANEOUS | Status: DC
  Administered 2011-09-07 – 2011-09-10 (×4): 40 mg via SUBCUTANEOUS
  Filled 2011-09-07 (×5): qty 0.4

## 2011-09-07 MED ORDER — CHLORHEXIDINE GLUCONATE CLOTH 2 % EX PADS
6.0000 | MEDICATED_PAD | Freq: Every day | CUTANEOUS | Status: DC
  Administered 2011-09-08 – 2011-09-10 (×3): 6 via TOPICAL

## 2011-09-07 NOTE — Progress Notes (Addendum)
   Subjective: Stable, no complaints   Objective: Vital signs in last 24 hours: Patient Vitals for the past 24 hrs:  BP Temp Temp src Pulse Resp SpO2 Height Weight  09/07/11 0555 94/68 mmHg 97.7 F (36.5 C) Oral 69  18  91 % - -  09/06/11 1727 124/78 mmHg 97.8 F (36.6 C) Oral 87  16  98 % 5\' 4"  (1.626 m) 123 lb 0.3 oz (55.8 kg)   Current Weight  09/06/11 123 lb 0.3 oz (55.8 kg)       PHYSICAL EXAM:  Heart: RRR Lungs: clear Wound: sternal wound with decent granulation tissue, some fatty necrosis. Right thigh wound with fatty necrosis, no drainage.  No surrounding erythema.   CHEST CT:  1. Open soft tissue wound along the inferior aspect of the  sternum. No evidence of drainable abscess.  2. The median sternotomy has a normal recent postoperative  appearance. There is no evidence of sternal dehiscence or bone  destruction. There is a small amount retrosternal fluid asymmetric  to the left.  3. Minimal left basilar atelectasis.  Wound culture: rare gram + cocci in pairs   Assessment/Plan: Continue local wound care/packing to thigh VAC to be placed on sternal wound Continue Vancomycin D#2   LOS: 1 day    COLLINS,GINA H 09/07/2011   I have seen and examined the patient and agree with the assessment and plan as outlined.  No sign of deep sternal infection on CT.  Possible d/c home by the end of the week with home wound VAC.  Will need to f/u wound culture to decide on antibiotic therapy, but wound problem looks to be primarily a failure of wound healing with skin edge and fat necrosis, not infection.  Breaunna Gottlieb H 09/07/2011 5:05 PM

## 2011-09-07 NOTE — Progress Notes (Signed)
UR Completed.  Finas Delone [name] [phone] 09/07/2011  

## 2011-09-07 NOTE — Progress Notes (Signed)
Patient [name], 55 year old white female feels anxiety about challenges to her health.  However, she feels encouraged by the support of her son.  Patient expressed appreciation for Chaplain's provision of pastoral presence, prayer, and conversation.  Will follow-up as needed.

## 2011-09-07 NOTE — Progress Notes (Signed)
   CARE MANAGEMENT NOTE 09/07/2011  Patient:  [name], [name]   Account Number:  [id number]  Date Initiated:  09/07/2011  Documentation initiated by:  [facility]  Subjective/Objective Assessment:   Wound infection - on IV antibiotics - plan for VAC.     Action/Plan:   Anticipated DC Date:  09/10/2011   Anticipated DC Plan:  HOME W HOME HEALTH SERVICES         Choice offered to / List presented to:             Status of service:  In process, will continue to follow Medicare Important Message given?   (If response is "NO", the following Medicare IM given date fields will be blank) Date Medicare IM given:   Date Additional Medicare IM given:    Discharge Disposition:    Per UR Regulation:  Reviewed for med. necessity/level of care/duration of stay  Comments:  09/07/11 Denise Socorro,RN,BSN 1115 MET WITH PT TO DISCUSS DC PLANNING.  PTA, PT RESIDED AT [facility].  SHE STATES SHE IS THROUGH WITH REHAB, AND WOULD LIKE TO RETURN HOME WITH HER SON AT DISCHARGE. IT APPEARS PT WILL NEED HOME VAC: WILL INITIATE [facility] WOUND VAC INSURANCE AUTHORIZATION FORMS FOR MD SIGNATURE.  PT WILL NEED HHRN AT DISCHARGE FOR VAC DRESSING CHANGES. WILL FOLLOW. Phone #[phone]  09-07-11 8:10am [name], RNBSN - [phone] UR Completed.

## 2011-09-08 MED ORDER — MUPIROCIN CALCIUM 2 % EX CREA: TOPICAL_CREAM | Freq: Two times a day (BID) | CUTANEOUS | Status: AC

## 2011-09-08 MED ORDER — VANCOMYCIN HCL 1000 MG IV SOLR: 1500.0000 mg | INTRAVENOUS | Status: AC

## 2011-09-08 MED ORDER — CIPROFLOXACIN HCL 500 MG PO TABS: 500.0000 mg | ORAL_TABLET | Freq: Two times a day (BID) | ORAL | Status: DC

## 2011-09-08 NOTE — Progress Notes (Signed)
   CARE MANAGEMENT NOTE 09/08/2011  Patient:  [name], [name]   Account Number:  [id number]  Date Initiated:  09/07/2011  Documentation initiated by:  [facility]  Subjective/Objective Assessment:   Wound infection - on IV antibiotics - plan for VAC.     Action/Plan:   Anticipated DC Date:  09/10/2011   Anticipated DC Plan:  HOME W HOME HEALTH SERVICES      DC Planning Services  CM consult      [facility] Choice  HOME HEALTH   Choice offered to / List presented to:  C-1 Patient   DME arranged  VAC  IV PUMP/EQUIPMENT      DME agency  [facility]  [facility]     [facility] arranged  HH-1 RN      [facility] agency  [facility]   Status of service:  In process, will continue to follow Medicare Important Message given?   (If response is "NO", the following Medicare IM given date fields will be blank) Date Medicare IM given:   Date Additional Medicare IM given:    Discharge Disposition:  HOME W HOME HEALTH SERVICES  Per UR Regulation:  Reviewed for med. necessity/level of care/duration of stay  Comments:  09/08/11 Chanze Teagle,RN,BSN 1200 COMPLETED AND SIGNED WOUND VAC FORMS FAXED TO [facility] PER [employer].  WILL AWAIT INSURANCE AUTHORIZATION FOR HOME WOUND VAC.  MET WITH PT TO ARRANGE HHRN.  REF[facility]AL TO [facility], PER CHOICE.  START OF CARE 24-48H POST DC DATE.  WILL FOLLOW. Phone #[phone]  09-08-11 7:40am Avie Arenas, RNBSN - [phone] UR Completed.  09/07/11 Gratia Disla,RN,BSN 1115 MET WITH PT TO DISCUSS DC PLANNING.  PTA, PT RESIDED AT [facility].  SHE STATES SHE IS THROUGH WITH REHAB, AND WOULD LIKE TO RETURN HOME WITH HER SON AT DISCHARGE. IT APPEARS PT WILL NEED HOME VAC: WILL INITIATE [facility] WOUND VAC INSURANCE AUTHORIZATION FORMS FOR MD SIGNATURE.  PT WILL NEED HHRN AT DISCHARGE FOR VAC DRESSING CHANGES. WILL FOLLOW. Phone #[phone]  09-07-11 8:10am Avie Arenas, RNBSN - [phone] UR Completed.

## 2011-09-08 NOTE — Progress Notes (Addendum)
Subjective:Feels well, no new complaints   Objective  Telemetry NSR/S Brady(50's)  Temp:  [97.4 F (36.3 C)-98.2 F (36.8 C)] 98.2 F (36.8 C) (12/05 0341) Pulse Rate:  [65-75] 69  (12/05 0341) Resp:  [18-20] 19  (12/05 0341) BP: (94-109)/(58-72) 95/63 mmHg (12/05 0341) SpO2:  [96 %-98 %] 97 % (12/05 0341)   Intake/Output Summary (Last 24 hours) at 09/08/11 0757 Last data filed at 09/07/11 2119  Gross per 24 hour  Intake   1220 ml  Output    401 ml  Net    819 ml       General appearance: alert, cooperative and no distress Heart: S1, S2 normal Lungs: clear to auscultation bilaterally Abdomen: soft, non-tender; bowel sounds normal; no masses,  no organomegaly Extremities: no edema Wound: VAC in place, no surrounding erethema, no purulence. R leg wound with scant purulent drainage on dressing with no cellulitis  Lab Results: No results found for this basename: NA:2,K:2,CL:2,CO2:2,GLUCOSE:2,BUN:2,CREATININE:2,CALCIUM:2,MG:2,PHOS:2 in the last 72 hours No results found for this basename: AST:2,ALT:2,ALKPHOS:2,BILITOT:2,PROT:2,ALBUMIN:2 in the last 72 hours No results found for this basename: LIPASE:2,AMYLASE:2 in the last 72 hours No results found for this basename: WBC:2,NEUTROABS:2,HGB:2,HCT:2,MCV:2,PLT:2 in the last 72 hours No results found for this basename: CKTOTAL:4,CKMB:4,TROPONINI:4 in the last 72 hours No results found for this basename: POCBNP:3 in the last 72 hours No results found for this basename: DDIMER in the last 72 hours No results found for this basename: HGBA1C in the last 72 hours No results found for this basename: CHOL,HDL,LDLCALC,TRIG,CHOLHDL in the last 72 hours No results found for this basename: TSH,T4TOTAL,FREET3,T3FREE,THYROIDAB in the last 72 hours No results found for this basename: VITAMINB12,FOLATE,FERRITIN,TIBC,IRON,RETICCTPCT in the last 72 hours  Medications: Scheduled    . aspirin EC  325 mg Oral Daily  . buPROPion  150 mg  Oral Daily  . Chlorhexidine Gluconate Cloth  6 each Topical Q0600  . ciprofloxacin  500 mg Oral BID  . enoxaparin (LOVENOX) injection  40 mg Subcutaneous Daily  . fenofibrate  160 mg Oral Daily  . lisinopril  10 mg Oral Daily  . metoprolol tartrate  12.5 mg Oral BID  . mupirocin cream   Topical BID  . mupirocin ointment  1 application Nasal BID  . pantoprazole  40 mg Oral Q1200  . rosuvastatin  20 mg Oral QHS  . vancomycin  1,500 mg Intravenous Q24H     Radiology/Studies:  Ct Chest Wo Contrast  09/07/2011  *RADIOLOGY REPORT*  Clinical Data: Recent median sternotomy.  Evaluate sternal wound for infection.  CT CHEST WITHOUT CONTRAST  Technique:  Multidetector CT imaging of the chest was performed following the standard protocol without IV contrast.  Comparison: Radiographs 08/30/2011.  Chest CT 08/03/2011.  Findings: Since the prior CT, the patient has undergone median sternotomy and CABG.  The sternotomy wires are intact.  There is no evidence of sternal dehiscence or bone destruction.  A small amount of retrosternal fluid is present asymmetric to the left.  There is no focal fluid collection.  There is an open wound with packing along the inferior aspect of the sternum.  The mediastinum otherwise appears stable with aortic atherosclerosis.  There are no enlarged mediastinal or hilar lymph nodes.  There is no pleural or pericardial effusion.  Mild left basilar atelectasis is present.  The lungs are otherwise clear.  The visualized upper abdomen appears unremarkable.  IMPRESSION:  1.  Open soft tissue wound along the inferior aspect of the sternum.  No evidence of drainable  abscess. 2.  The median sternotomy has a normal recent postoperative appearance.  There is no evidence of sternal dehiscence or bone destruction.  There is a small amount retrosternal fluid asymmetric to the left. 3.  Minimal left basilar atelectasis.  Original Report Authenticated By: Gerrianne Scale, M.D.    INR: Will add  last result for INR, ABG once components are confirmed Will add last 4 CBG results once components are confirmed  Assessment/Plan: 1. Clinically quite stable on current rx. 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 Will need to se up home vac with(?) IV vanco. Consider d/c cipro    LOS: 2 days    [name],[name] 12/5/20127:57 AM   I have seen and examined [name] and agree with the above assessment  and plan. D/c cipro, home with vac Delight Ovens MD 09/08/2011 4:06 PM

## 2011-09-08 NOTE — Progress Notes (Signed)
Call from bedside nurse 08/08/11-1700 to discuss VAC placement on sternal wound.  WOC nurse discussed placement of NPWT VAC dressing with bedside nursing. Offered instructions over the phone on best practice for dressing placement, use of non adherent layer in wound bed due to proximity to sternum and use of black foam (mushroom) under TRAC pad to protect periwound skin.  VAC placed by bedside nursing without problems.  Assessment of VAC dressing this am, intact with accurate placement/application of dressing.  VAC running continuously at suction with seal of dressing. Explained to pt that VAC dressing would be changed by bedside nurse M/W/F and that [facility] will be able to change dressing once at home.  Discussed with bedside nurse, charge nurse and CVTS NP/PA.

## 2011-09-08 NOTE — Plan of Care (Signed)
Problem: Problem: Skin/Wound Progression Goal: VAC APPROVAL Outcome: Completed/Met Date Met:  09/08/11 Vac placed on pt today suction at 100.

## 2011-09-09 LAB — WOUND CULTURE

## 2011-09-09 MED ORDER — SODIUM CHLORIDE 0.9 % IJ SOLN
10.0000 mL | INTRAMUSCULAR | Status: DC | PRN
  Administered 2011-09-10: 10 mL

## 2011-09-09 NOTE — Progress Notes (Signed)
CSW received referral for pt admitted from SNF. Plan is for pt to d/c home with home health and son, per NCM. CSW has advised SNF of pt disposition and not signing on.  Baxter Flattery, MSW [phone]

## 2011-09-09 NOTE — Progress Notes (Addendum)
  Subjective: Continues to feel well  Objective  Telemetry NSR  Temp:  [97.8 F (36.6 C)-98.4 F (36.9 C)] 98.4 F (36.9 C) (12/06 0542) Pulse Rate:  [6-73] 73  (12/06 0542) Resp:  [18] 18  (12/06 0542) BP: (95-116)/(61-79) 98/62 mmHg (12/06 0542) SpO2:  [95 %-99 %] 95 % (12/06 0542)   Intake/Output Summary (Last 24 hours) at 09/09/11 0754 Last data filed at 09/08/11 2000  Gross per 24 hour  Intake    600 ml  Output    300 ml  Net    300 ml       General appearance: alert, cooperative and no distress Heart: regular rate and rhythm Lungs: clear to auscultation bilaterally Abdomen: soft, non-tender; bowel sounds normal; no masses,  no organomegaly Extremities: no edema, redness or tenderness in the calves or thighs Wound: vac in place, thigh wound stable  Lab Results: No results found for this basename: NA:2,K:2,CL:2,CO2:2,GLUCOSE:2,BUN:2,CREATININE:2,CALCIUM:2,MG:2,PHOS:2 in the last 72 hours No results found for this basename: AST:2,ALT:2,ALKPHOS:2,BILITOT:2,PROT:2,ALBUMIN:2 in the last 72 hours No results found for this basename: LIPASE:2,AMYLASE:2 in the last 72 hours No results found for this basename: WBC:2,NEUTROABS:2,HGB:2,HCT:2,MCV:2,PLT:2 in the last 72 hours No results found for this basename: CKTOTAL:4,CKMB:4,TROPONINI:4 in the last 72 hours No results found for this basename: POCBNP:3 in the last 72 hours No results found for this basename: DDIMER in the last 72 hours No results found for this basename: HGBA1C in the last 72 hours No results found for this basename: CHOL,HDL,LDLCALC,TRIG,CHOLHDL in the last 72 hours No results found for this basename: TSH,T4TOTAL,FREET3,T3FREE,THYROIDAB in the last 72 hours No results found for this basename: VITAMINB12,FOLATE,FERRITIN,TIBC,IRON,RETICCTPCT in the last 72 hours  Medications: Scheduled    . aspirin EC  325 mg Oral Daily  . buPROPion  150 mg Oral Daily  . Chlorhexidine Gluconate Cloth  6 each Topical  Q0600  . enoxaparin (LOVENOX) injection  40 mg Subcutaneous Daily  . fenofibrate  160 mg Oral Daily  . lisinopril  10 mg Oral Daily  . metoprolol tartrate  12.5 mg Oral BID  . mupirocin cream   Topical BID  . mupirocin ointment  1 application Nasal BID  . pantoprazole  40 mg Oral Q1200  . rosuvastatin  20 mg Oral QHS  . vancomycin  1,500 mg Intravenous Q24H  . DISCONTD: ciprofloxacin  500 mg Oral BID     Radiology/Studies:  No results found.  INR: Will add last result for INR, ABG once components are confirmed Will add last 4 CBG results once components are confirmed  Assessment/Plan: 1. Appears stable for d/c with home IV vanco and VAC. Will get Picc placed. F/U Monday at office per Dr Dennie Maizes instructions    LOS: 3 days    GOLD,WAYNE E 12/6/20127:54 AM  Pt. Seen and examined\Agree with above. Wound cx + MRSA.  Continue vac/ IV Vanco

## 2011-09-09 NOTE — Discharge Summary (Signed)
[name] [birth year] 55 y.o. [id number]  09/06/2011   Purcell Nails, MD  Cabg on NOV 2  sternal wound infection [facility] admission note Patient returns for follow-up having undergone CABG x3 on 08/05/2011. She was just seen in the office last week at which time she was noted to have some skin edge necrosis and poor healing at the inferior aspect of her sternal incision and along the mid part of her right thigh saphenous vein harvest incision. These areas were debrided sharply in the office and local wound care was prescribed to be performed at the skilled nursing facility where the patient has been staying. The patient was brought back for office today for wound check her son was concerned about the fact that she apparently has not been receiving daily wound care. The patient has developed some drainage from the him for aspect of her sternal incision and the family is concerned that the instructions for wound packing was not being performed. The patient states that she otherwise feels well she denies any fevers or chills. She does have some persistent pain in her chest. We will admit the patient to the hospital for intravenous antibiotics, local wound care, and chest CT scan. She will need wound VAC placement. If the CT scan looks favorable and there is no sign of deep sternal wound infection, is possible that the patient could be discharged within the week for continued care at home with home health care. The patient's son and family are very supportive and would be agreeable to making arrangements for further home care as needed.  Past Medical History   Diagnosis  Date   .  Aortic aneurysm    .  Depression    .  Anxiety    .  Acid reflux    .  Claudication    .  DJD (degenerative joint disease) of lumbar spine    .  Coronary artery disease     Past Surgical History   Procedure  Date   .  Cholecystectomy    .  Abdominal hysterectomy    .  Spine surgery  2000 & 2002     lumbar  laminectiomies   .  Aortogram with runoff  04/27/2011     Bilateral runoff by Dr. Myra Gianotti   .  Coronary artery bypass graft  08/05/2011     CABG x3 using LIMA to D1, SVG to OM, SVG to PDA, open vein harvest bilateral thighs    Family History   Problem  Relation  Age of Onset   .  Diabetes  Mother    .  Diabetes  Sister    Social History  History   Substance Use Topics   .  Smoking status:  Former Smoker -- 0.5 packs/day for 30 years     Types:  Cigarettes     Quit date:  08/05/2011   .  Smokeless tobacco:  Not on file   .  Alcohol Use:  No    Current Outpatient Prescriptions   Medication  Sig  Dispense  Refill   .  aspirin 325 MG EC tablet  Take 1 tablet (325 mg total) by mouth daily.  30 tablet  1   .  buPROPion (WELLBUTRIN SR) 150 MG 12 hr tablet  Take 150 mg by mouth 2 (two) times daily.     .  fenofibrate 160 MG tablet  Take 1 tablet (160 mg total) by mouth daily.  30 tablet  1   .  lisinopril (PRINIVIL,ZESTRIL) 10 MG tablet  Take 1 tablet (10 mg total) by mouth daily.  30 tablet  1   .  metoprolol tartrate (LOPRESSOR) 12.5 mg TABS  Take 0.5 tablets (12.5 mg total) by mouth 2 (two) times daily.  30 tablet  1   .  nicotine (NICODERM CQ - DOSED IN MG/24 HOURS) 21 mg/24hr patch  Place 1 patch onto the skin daily.     Marland Kitchen  omeprazole (PRILOSEC) 40 MG capsule  Take 40 mg by mouth daily.     .  polysaccharide iron (NIFEREX) 150 MG CAPS capsule  Take 1 capsule (150 mg total) by mouth daily.  30 each  1   .  rosuvastatin (CRESTOR) 20 MG tablet  Take 1 tablet (20 mg total) by mouth daily at 6 PM.  30 tablet  1   .  traMADol (ULTRAM) 50 MG tablet  Take 50 mg by mouth every 6 (six) hours as needed. Maximum dose= 8 tablets per day     Review of Systems: at time of admission General: normal appetite, normal energy  Respiratory: no cough, no wheezing, no hemoptysis, no pain with inspiration or cough, no shortness of breath  Cardiac: mild chest pain with movement and cough, not with exertion, no  exertional SOB, no resting SOB, no PND, no orthopnea, no LE edema, no palpitations, no syncope  GI: no difficulty swallowing, no hematochezia, no hematemesis, no melena, no constipation, no diarrhea  GU: no dysuria, no urgency, no frequency  Musculoskeletal: no arthritis, no arthralgia  Vascular: no pain suggestive of claudication  Neuro: no symptoms suggestive of TIA's, no seizures, no headaches, no peripheral neuropathy  Endocrine: Negative  HEENT: no loose teeth or painful teeth, no recent vision changes  Psych: no anxiety, no depression  Physical Exam: at time of admission BP 124/78  Pulse 87  Temp(Src) 97.8 F (36.6 C) (Oral)  Resp 16  Ht 5\' 4"  (1.626 m)  Wt 123 lb 0.3 oz (55.8 kg)  BMI 21.12 kg/m2  SpO2 98%  General: well-appearing  HEENT: Unremarkable  Neck: no JVD, no bruits, no adenopathy  Chest: clear to auscultation, symmetrical breath sounds, no wheezes, no rhonchi, the inferior aspect of the patient's sternal incision is separated and there is no packing in the wound. There is no purulent drainage but there is fat necrosis and debris in the wound that has not been cleansed. The wound is sharply debrided in the office there appears to be healthy tissue beneath. The sternum is stable on palpation. There is no surrounding cellulitis.  CV: RRR, no murmur  Abdomen: soft, non-tender, no masses  Extremities: warm, well-perfused, pulses not palpable, The right thigh wound has some fat necrosis but no purulence nor surrounding cellulitie. The wound is debris sharply. There is no LE edema  Rectal/GU Deferred  Neuro: Grossly non-focal and symmetrical throughout  Skin: Clean and dry, no rashes, no breakdown   hospital course:  The patient was admitted to the hospital and a VAC device was placed. Cultures have grown methicillin resistant staph aureus. She has been treated with a 5 day course of fluoroquinolone which has subsequently been stopped. Additionally she is being treated with  intravenous vancomycin. She additionally does have a small superficial thigh wound. This is being treated with dressing changes. She has remained otherwise clinically quite stable. She will be discharged home with nursing services for ongoing management of the vac.        No results found for this basename:  NA:2,K:2,CL:2,CO2:2,GLUCOSE:2,BUN:2,CRATININE:2,CALCIUM:2 in the last 72 hours No results found for this basename: WBC:2,HGB:2,HCT:2,PLT:2 in the last 72 hours No results found for this basename: INR:2 in the last 72 hours   Discharge Instructions:  The patient is discharged to home with extensive instructions on wound care and progressive ambulation.  They are instructed not to drive or perform any heavy lifting until returning to see the physician in his office.  Discharge Diagnosis:  Cabg on NOV 2  sternal wound infection  Secondary Diagnosis: Patient Active Problem List  Diagnoses  . PAD (peripheral artery disease)  . Claudication  . AAA (abdominal aortic aneurysm)  . CAD (coronary artery disease)  . S/P CABG x 3  . Carotid stenosis, bilateral  . Depression  . Fatigue  . GERD (gastroesophageal reflux disease)  . Anxiety  . Tobacco abuse  . Postsurgical aortocoronary bypass status  . Wound dehiscence   Past Medical History  Diagnosis Date  . Aortic aneurysm   . Depression   . Anxiety   . Acid reflux   . Claudication   . DJD (degenerative joint disease) of lumbar spine   . Coronary artery disease        Kenslei, Hearty  Home Medication Instructions RUE:[id number]   Printed on:09/09/11 0758  Medication Information                    omeprazole (PRILOSEC) 40 MG capsule Take 40 mg by mouth daily.             fenofibrate 160 MG tablet Take 1 tablet (160 mg total) by mouth daily.           lisinopril (PRINIVIL,ZESTRIL) 10 MG tablet Take 1 tablet (10 mg total) by mouth daily.           metoprolol tartrate (LOPRESSOR) 12.5 mg TABS Take 0.5 tablets (12.5 mg  total) by mouth 2 (two) times daily.           polysaccharide iron (NIFEREX) 150 MG CAPS capsule Take 1 capsule (150 mg total) by mouth daily.           rosuvastatin (CRESTOR) 20 MG tablet Take 1 tablet (20 mg total) by mouth daily at 6 PM.           aspirin 325 MG EC tablet Take 1 tablet (325 mg total) by mouth daily.           traMADol (ULTRAM) 50 MG tablet Take 50 mg by mouth every 6 (six) hours as needed. For pain. Maximum dose= 8 tablets per day           buPROPion (WELLBUTRIN SR) 150 MG 12 hr tablet Take 150 mg by mouth 2 (two) times daily.             nicotine (NICODERM CQ - DOSED IN MG/24 HOURS) 21 mg/24hr patch Place 1 patch onto the skin daily.             mupirocin cream (BACTROBAN) 2 % Apply topically 2 (two) times daily.           sodium chloride 0.9 % SOLN 500 mL with vancomycin 1000 MG SOLR 1,500 mg Inject 1,500 mg into the vein daily.             Disposition: Discharged home in good condition   Gershon Crane, [place] 09/09/2011  7:58 AM

## 2011-09-09 NOTE — Progress Notes (Signed)
ANTIBIOTIC CONSULT NOTE - FOLLOW UP  Pharmacy Consult for vancomycin Indication: sternal wound with moderate staph aureus  Allergies  Allergen Reactions  . Celebrex (Celecoxib) Swelling  . Eggs Or Egg-Derived Products     REACTION: tolerates flu vaccine, though    Patient Measurements: Height: 5\' 4"  (162.6 cm) Weight: 123 lb 0.3 oz (55.8 kg) IBW/kg (Calculated) : 54.7   Vital Signs: Temp: 98.4 F (36.9 C) (12/06 0542) Temp src: Oral (12/06 0542) BP: 98/62 mmHg (12/06 0542) Pulse Rate: 73  (12/06 0542) Intake/Output from previous day: 12/05 0701 - 12/06 0700 In: 600 [P.O.:600] Out: 300 [Urine:300]  Microbiology: Recent Results (from the past 720 hour(s))  MRSA PCR SCREENING     Status: Abnormal   Collection Time   09/06/11  5:42 PM      Component Value Range Status Comment   MRSA by PCR POSITIVE (*) NEGATIVE  Final   WOUND CULTURE     Status: Normal (Preliminary result)   Collection Time   09/06/11 11:28 PM      Component Value Range Status Comment   Specimen Description WOUND   Final    Special Requests STERNAL   Final    Gram Stain     Final    Value: RARE WBC PRESENT, PREDOMINANTLY PMN     NO SQUAMOUS EPITHELIAL CELLS SEEN     RARE GRAM POSITIVE COCCI IN PAIRS   Culture     Final    Value: MODERATE STAPHYLOCOCCUS AUREUS     Note: RIFAMPIN AND GENTAMICIN SHOULD NOT BE USED AS SINGLE DRUGS FOR TREATMENT OF STAPH INFECTIONS.   Report Status PENDING   Incomplete      Assessment: Day # 4 of vanc for nonhealing sternal wound. Cipro stopped today. Afebrile. Wound cx moderate staph aureus. She has gotten 3 doses of vancomycin 1500mg  IV q24 in the evenings around 8pm  Goal of Therapy:  Vancomycin trough level 10-15 mcg/ml  Plan:  To dc home w/ [facility] to get vanc 1500mg  IV q24 via PICC line and w/ wound VAC. Would check weekly vancomycin troughs and weekly serum creatinine.   Len Childs T 09/09/2011,8:59 AM Pager: [phone]

## 2011-09-09 NOTE — Progress Notes (Signed)
   CARE MANAGEMENT NOTE 09/09/2011  Patient:  [name], [name]   Account Number:  [id number]  Date Initiated:  09/07/2011  Documentation initiated by:  [facility]  Subjective/Objective Assessment:   Wound infection - on IV antibiotics - plan for VAC.     Action/Plan:   Anticipated DC Date:  09/10/2011   Anticipated DC Plan:  HOME W HOME HEALTH SERVICES      DC Planning Services  CM consult      [facility] Choice  HOME HEALTH   Choice offered to / List presented to:  C-1 Patient   DME arranged  VAC  IV PUMP/EQUIPMENT      DME agency  [facility]  [facility]     [facility] arranged  HH-1 RN      [facility] agency  [facility]   Status of service:  In process, will continue to follow Medicare Important Message given?   (If response is "NO", the following Medicare IM given date fields will be blank) Date Medicare IM given:   Date Additional Medicare IM given:    Discharge Disposition:  HOME W HOME HEALTH SERVICES  Per UR Regulation:  Reviewed for med. necessity/level of care/duration of stay  Comments:  09/09/11 Denise Tsang,RN,BSN PT TO DC HOME ON IV VANCOMYCIN VIA PICC LINE, AND WITH WOUND VAC.  PT LIVES WITH HER SON [name], WHO WORKS, BUT STATES HE WILL BE ABLE TO ASSIST WITH IV THERAPY INFUSIONS.  SON STATES HE IS AT HOME IN THE EVENINGS AND AT NIGHT.  PT READY FOR DC HOME ONCE WOUND VAC OBTAINED. AWAITING INSURANCE AUTH/WOUND VAC RELEASE AND DELIVERY BY [facility].  AROUND 1530, [facility] STATED THEY WOULD BE DELIVERING WOUND VAC THIS EVENING BY 9PM TODAY.  PT ALSO NEEDS 2 HR IV VANCOMYCIN DOSE THIS EVENING.  DUE TO LATE ARRIVAL OF WOUND VAC, FEEL IT IS BEST FOR PT TO BE DISCHARGED IN AM. NOTIFIED NURSE OF ALL EVENTS.  09/08/11 Denise Olander,RN,BSN 1200 COMPLETED AND SIGNED WOUND VAC FORMS FAXED TO [facility] PER [employer]OT[employer]OL.  WILL AWAIT INSURANCE AUTHORIZATION FOR HOME WOUND VAC.  MET WITH PT TO ARRANGE HHRN.  REFERRAL TO [facility], PER CHOICE.  START OF CARE 24-48H POST DC DATE.   WILL FOLLOW.  09-08-11 7:40am [name], RNBSN [phone] UR Completed.  09/07/11 Denise Kopecky,RN,BSN 1115 MET WITH PT TO DISCUSS DC PLANNING.  PTA, PT RESIDED AT [facility].  SHE STATES SHE IS THROUGH WITH REHAB, AND WOULD LIKE TO RETURN HOME WITH HER SON AT DISCHARGE. IT APPEARS PT WILL NEED HOME VAC: WILL INITIATE [facility] WOUND VAC INSURANCE AUTHORIZATION FORMS FOR MD SIGNATURE.  PT WILL NEED HHRN AT DISCHARGE FOR VAC DRESSING CHANGES. WILL FOLLOW. Phone #[phone]  09-07-11 8:10am [name], RNBSN - [phone] UR Completed.

## 2011-09-10 ENCOUNTER — Encounter: Payer: Self-pay | Admitting: *Deleted

## 2011-09-10 MED ORDER — BUPROPION HCL ER (SR) 150 MG PO TB12: 150.0000 mg | ORAL_TABLET | Freq: Two times a day (BID) | ORAL | Status: DC

## 2011-09-10 MED ORDER — ROSUVASTATIN CALCIUM 20 MG PO TABS: 20.0000 mg | ORAL_TABLET | Freq: Every day | ORAL | Status: DC

## 2011-09-10 MED ORDER — TRAMADOL HCL 50 MG PO TABS: 50.0000 mg | ORAL_TABLET | Freq: Four times a day (QID) | ORAL | Status: DC | PRN

## 2011-09-10 MED ORDER — LISINOPRIL 10 MG PO TABS: 10.0000 mg | ORAL_TABLET | Freq: Every day | ORAL | Status: DC

## 2011-09-10 MED ORDER — FENOFIBRATE 160 MG PO TABS: 160.0000 mg | ORAL_TABLET | Freq: Every day | ORAL | Status: DC

## 2011-09-10 MED ORDER — HEPARIN SOD (PORK) LOCK FLUSH 100 UNIT/ML IV SOLN
250.0000 [IU] | INTRAVENOUS | Status: AC | PRN
  Administered 2011-09-10: 250 [IU]

## 2011-09-10 MED ORDER — POLYSACCHARIDE IRON 150 MG PO CAPS: 150.0000 mg | ORAL_CAPSULE | Freq: Every day | ORAL | Status: DC

## 2011-09-10 MED ORDER — METOPROLOL TARTRATE 12.5 MG HALF TABLET: 12.5000 mg | ORAL_TABLET | Freq: Two times a day (BID) | ORAL | Status: DC

## 2011-09-10 NOTE — Progress Notes (Signed)
   Subjective: Patient without complaints. She really wants to go home.  Objective: Vital signs in last 24 hours: Patient Vitals for the past 24 hrs:  BP Temp Temp src Pulse Resp SpO2  09/10/11 0408 94/60 mmHg 97.8 F (36.6 C) Oral 68  18  93 %  09/09/11 2201 97/56 mmHg 97.6 F (36.4 C) Oral 65  19  96 %  09/09/11 1348 97/56 mmHg 98.3 F (36.8 C) Oral 59  20  96 %    Current Weight  09/06/11 123 lb 0.3 oz (55.8 kg)      Intake/Output from previous day: 12/06 0701 - 12/07 0700 In: 240 [P.O.:240] Out: 200 [Urine:200]   Physical Exam:  Cardiovascular: RRR Pulmonary: Clear to auscultation bilaterally; no rales, wheezes, or rhonchi. Abdomen: Soft, non tender, bowel sounds present. Extremities:No lower extremity edema. Minor purulent like drainage from right EVH site (no erythema). Wounds: Clean and dry.  No erythema or purulence. VAC in place lower sternal wound.   Assessment/Plan:  Surgically stable for discharge with home health, wound VAC, and IV Vancomycin.    Ardelle Balls, PA 09/10/2011

## 2011-09-10 NOTE — Progress Notes (Signed)
Pt being discharge home with home health to follow.  Wound Vac and PICC line in place.  Home health to admin IV vanc and follow wound.  Pt has son who will assist.  All instructions given and verbalized understood.

## 2011-09-10 NOTE — Consult Note (Signed)
WOC consult Note Reason for Consult: Sternal vac dressing change. Wound type: Midline full thickness dehisced incision. Measurement:1.8X1.2X2.5cm Wound bed: 80% red, 20 yellow Drainage (amount, consistency, odor) Mod yellow drainage in cannister, no odor. Periwound: intact. Dressing procedure/placement/frequency: Mepitel applied to protect wound bed and one piece black foam to 125 cm.  Pt tolerated with minimal discomfort.  Preparing for d/c home.  Connected to freedom vac and discussed troubleshooting. Pt denies further questions at this time. Will not plan to follow further unless re-consulted.  [address] RN, MSN, [employer]  [phone]

## 2011-09-13 ENCOUNTER — Ambulatory Visit (INDEPENDENT_AMBULATORY_CARE_PROVIDER_SITE_OTHER): Payer: Self-pay | Admitting: Thoracic Surgery (Cardiothoracic Vascular Surgery)

## 2011-09-13 ENCOUNTER — Encounter: Payer: Self-pay | Admitting: *Deleted

## 2011-09-13 VITALS — BP 107/59 | HR 65 | Resp 16

## 2011-09-13 DIAGNOSIS — I2510 Atherosclerotic heart disease of native coronary artery without angina pectoris: Secondary | ICD-10-CM

## 2011-09-13 DIAGNOSIS — Z951 Presence of aortocoronary bypass graft: Secondary | ICD-10-CM

## 2011-09-13 DIAGNOSIS — T8131XA Disruption of external operation (surgical) wound, not elsewhere classified, initial encounter: Secondary | ICD-10-CM | POA: Insufficient documentation

## 2011-09-13 NOTE — Progress Notes (Signed)
[address]            [place] [address]          [phone]     CARDIOTHORACIC SURGERY OFFICE NOTE  Referring Provider is Nanetta Batty, MD PCP is Lonia Blood, MD   HPI:  Patient returns for follow-up of her surgical wounds. She was seen in the office last week and hospitalized for wound debridement and wound VAC placement involving an area of skin edge necrosis and fat necrosis involving the inferior aspect of her median sternotomy incision. She also has skin edge necrosis and fat necrosis involving a small area of the midportion of the right thigh vein harvest incision. Wound cultures grew methicillin-resistant Staphylococcus aureus. A wound VAC was placed into the small sternal wound and the patient was discharged from the hospital 4 days ago on home IV antibiotics and wound care. The patient reports she is doing well. She has minimal pain. She claims that her blood sugars have been under good control. She has not had any problems with wound care so far and home health therapy is tending to her needs.   Current Outpatient Prescriptions  Medication Sig Dispense Refill  . aspirin 325 MG EC tablet Take 1 tablet (325 mg total) by mouth daily.  30 tablet  1  . buPROPion (WELLBUTRIN SR) 150 MG 12 hr tablet Take 1 tablet (150 mg total) by mouth 2 (two) times daily.  30 tablet  0  . fenofibrate 160 MG tablet Take 1 tablet (160 mg total) by mouth daily.  30 tablet  1  . lisinopril (PRINIVIL,ZESTRIL) 10 MG tablet Take 1 tablet (10 mg total) by mouth daily.  30 tablet  1  . metoprolol tartrate (LOPRESSOR) 12.5 mg TABS Take 0.5 tablets (12.5 mg total) by mouth 2 (two) times daily.  30 tablet  1  . mupirocin cream (BACTROBAN) 2 % Apply topically 2 (two) times daily.  15 g  0  . omeprazole (PRILOSEC) 40 MG capsule Take 40 mg by mouth daily.        . polysaccharide iron (NIFEREX) 150 MG CAPS capsule Take 1 capsule (150 mg total) by mouth daily. for one month  then stop.  30 each  0  . rosuvastatin (CRESTOR) 20 MG tablet Take 1 tablet (20 mg total) by mouth daily at 6 PM.  30 tablet  1  . sodium chloride 0.9 % SOLN 500 mL with vancomycin 1000 MG SOLR 1,500 mg Inject 1,500 mg into the vein daily.  10 each  0  . traMADol (ULTRAM) 50 MG tablet Take 1-2 tablets (50-100 mg total) by mouth every 6 (six) hours as needed for pain. For pain. Maximum dose= 8 tablets per day  45 tablet  0  . nicotine (NICODERM CQ - DOSED IN MG/24 HOURS) 21 mg/24hr patch Place 1 patch onto the skin daily.            Physical Exam:   BP 107/59  Pulse 65  Resp 16  SpO2 99%  HEENT:  Unremarkable  Chest:   Breath sounds are clear to auscultation, sternal incision is healing well with exception of the open wound at the inferior aspect just below the xiphoid process. This wound is quite clean and granulating nicely so far. There appeared to be minimal drainage  CV:   Regular rate and rhythm  Abdomen:  Soft and nontender  Extremities:  Warm and well-perfused. The small open wound in the upper portion of the right thigh is shallow, granulating nicely, and appears clean  Diagnostic Tests:  n/a   Impression:  Satisfactory progress with greatly improved appearance of the small open wounds involving the inferior aspect of the median sternotomy incision as well as a right thigh. There is no sign of ongoing infection at this time. The patient is completing a course of IV vancomycin because the original wound swab culture grew methicillin-resistant Staphylococcus aureus. The appearance of the wound was most consistent with primary failure of wound healing with skin edge and fat necrosis rather than a primary infection.  Plan:  Continue wound care as previously arranged. Continue IV vancomycin for 2 weeks then stop. We will plan to see the patient back in 3-4 weeks for further followup and wound check.   Salvatore Decent. Cornelius Moras, MD 09/13/2011 4:25 PM

## 2011-09-13 NOTE — Discharge Summary (Signed)
I agree with the above discharge summary and plan for follow-up.  OWEN,CLARENCE H  

## 2011-09-16 ENCOUNTER — Encounter: Payer: Self-pay | Admitting: *Deleted

## 2011-09-20 ENCOUNTER — Ambulatory Visit: Payer: Self-pay | Admitting: Thoracic Surgery (Cardiothoracic Vascular Surgery)

## 2011-10-18 ENCOUNTER — Other Ambulatory Visit: Payer: Self-pay | Admitting: Physician Assistant

## 2011-11-01 ENCOUNTER — Encounter: Payer: Self-pay | Admitting: Thoracic Surgery (Cardiothoracic Vascular Surgery)

## 2011-11-01 ENCOUNTER — Ambulatory Visit (INDEPENDENT_AMBULATORY_CARE_PROVIDER_SITE_OTHER): Payer: Self-pay | Admitting: Thoracic Surgery (Cardiothoracic Vascular Surgery)

## 2011-11-01 VITALS — BP 113/69 | HR 70 | Resp 16 | Ht 64.0 in | Wt 129.0 lb

## 2011-11-01 DIAGNOSIS — I2510 Atherosclerotic heart disease of native coronary artery without angina pectoris: Secondary | ICD-10-CM

## 2011-11-01 DIAGNOSIS — Z951 Presence of aortocoronary bypass graft: Secondary | ICD-10-CM

## 2011-11-01 DIAGNOSIS — T8131XA Disruption of external operation (surgical) wound, not elsewhere classified, initial encounter: Secondary | ICD-10-CM

## 2011-11-01 NOTE — Progress Notes (Signed)
[address]            [name] [address]          [phone]     CARDIOTHORACIC SURGERY OFFICE NOTE  Referring Provider is Runell Gess, MD PCP is Lonia Blood, MD, MD   HPI:  Patient returns for follow-up of her surgical wounds s/p CABG 08/05/2011.  Since she was last seen here in the office all of her surgical wounds have healed completely. She reports that she's not having any problems at all. She denies any fevers or chills. She's not having any pain in her chest. She denies any shortness of breath either with activity or at rest. She reports that her blood sugars are under good control. She does state that she notices that when she lays in bed at night her right arm seems to go to sleep and become numb but when she gets up and moves around he returns to normal very quickly. This never occurs other than when she is laying on her side in bed. Remainder of her review of systems unremarkable.   Current Outpatient Prescriptions  Medication Sig Dispense Refill  . aspirin 325 MG EC tablet Take 1 tablet (325 mg total) by mouth daily.  30 tablet  1  . lisinopril (PRINIVIL,ZESTRIL) 10 MG tablet Take 1 tablet (10 mg total) by mouth daily.  30 tablet  1  . metoprolol tartrate (LOPRESSOR) 12.5 mg TABS Take 0.5 tablets (12.5 mg total) by mouth 2 (two) times daily.  30 tablet  1  . omeprazole (PRILOSEC) 40 MG capsule Take 40 mg by mouth daily.        . rosuvastatin (CRESTOR) 20 MG tablet Take 1 tablet (20 mg total) by mouth daily at 6 PM.  30 tablet  1  . traMADol (ULTRAM) 50 MG tablet TAKE 1-2 TABLET EVERY 6 HOURS AS NEEDED FOR PAIN  45 tablet  0  . buPROPion (WELLBUTRIN SR) 150 MG 12 hr tablet Take 1 tablet (150 mg total) by mouth 2 (two) times daily.  30 tablet  0  . fenofibrate 160 MG tablet Take 1 tablet (160 mg total) by mouth daily.  30 tablet  1  . nicotine (NICODERM CQ - DOSED IN MG/24 HOURS) 21 mg/24hr patch Place 1 patch onto the skin daily.          . polysaccharide iron (NIFEREX) 150 MG CAPS capsule Take 1 capsule (150 mg total) by mouth daily. for one month then stop.  30 each  0      Physical Exam:   BP 113/69  Pulse 70  Resp 16  Ht 5\' 4"  (1.626 m)  Wt 129 lb (58.514 kg)  BMI 22.14 kg/m2  SpO2 96%  General:  Well-appearing  Chest:   Clear to auscultation. The median sternotomy incision has healed completely and the sternum is stable on palpation  CV:   Regular rate and rhythm  Abdomen:  Soft and nontender  Extremities:  Warm and well-perfused. The vein harvest incision has healed completely  Diagnostic Tests:  n/a   Impression:  Patient is doing well following coronary artery bypass grafting November 2012. All of her surgical wounds have healed completely  Plan:  The future the patient will call and return to see [place] as needed. All of her questions been addressed.   Salvatore Decent. Cornelius Moras, MD 11/01/2011 4:49 PM

## 2011-11-02 ENCOUNTER — Other Ambulatory Visit: Payer: Self-pay | Admitting: Physician Assistant

## 2011-11-07 IMAGING — CR DG CHEST 2V
2 series · 2 of 2 positions shown · non-contrast
Comparison: 08/06/2010

CLINICAL DATA: Preop bypass graft.

CHEST - 2 VIEW

[view not recorded (1 of 2)]
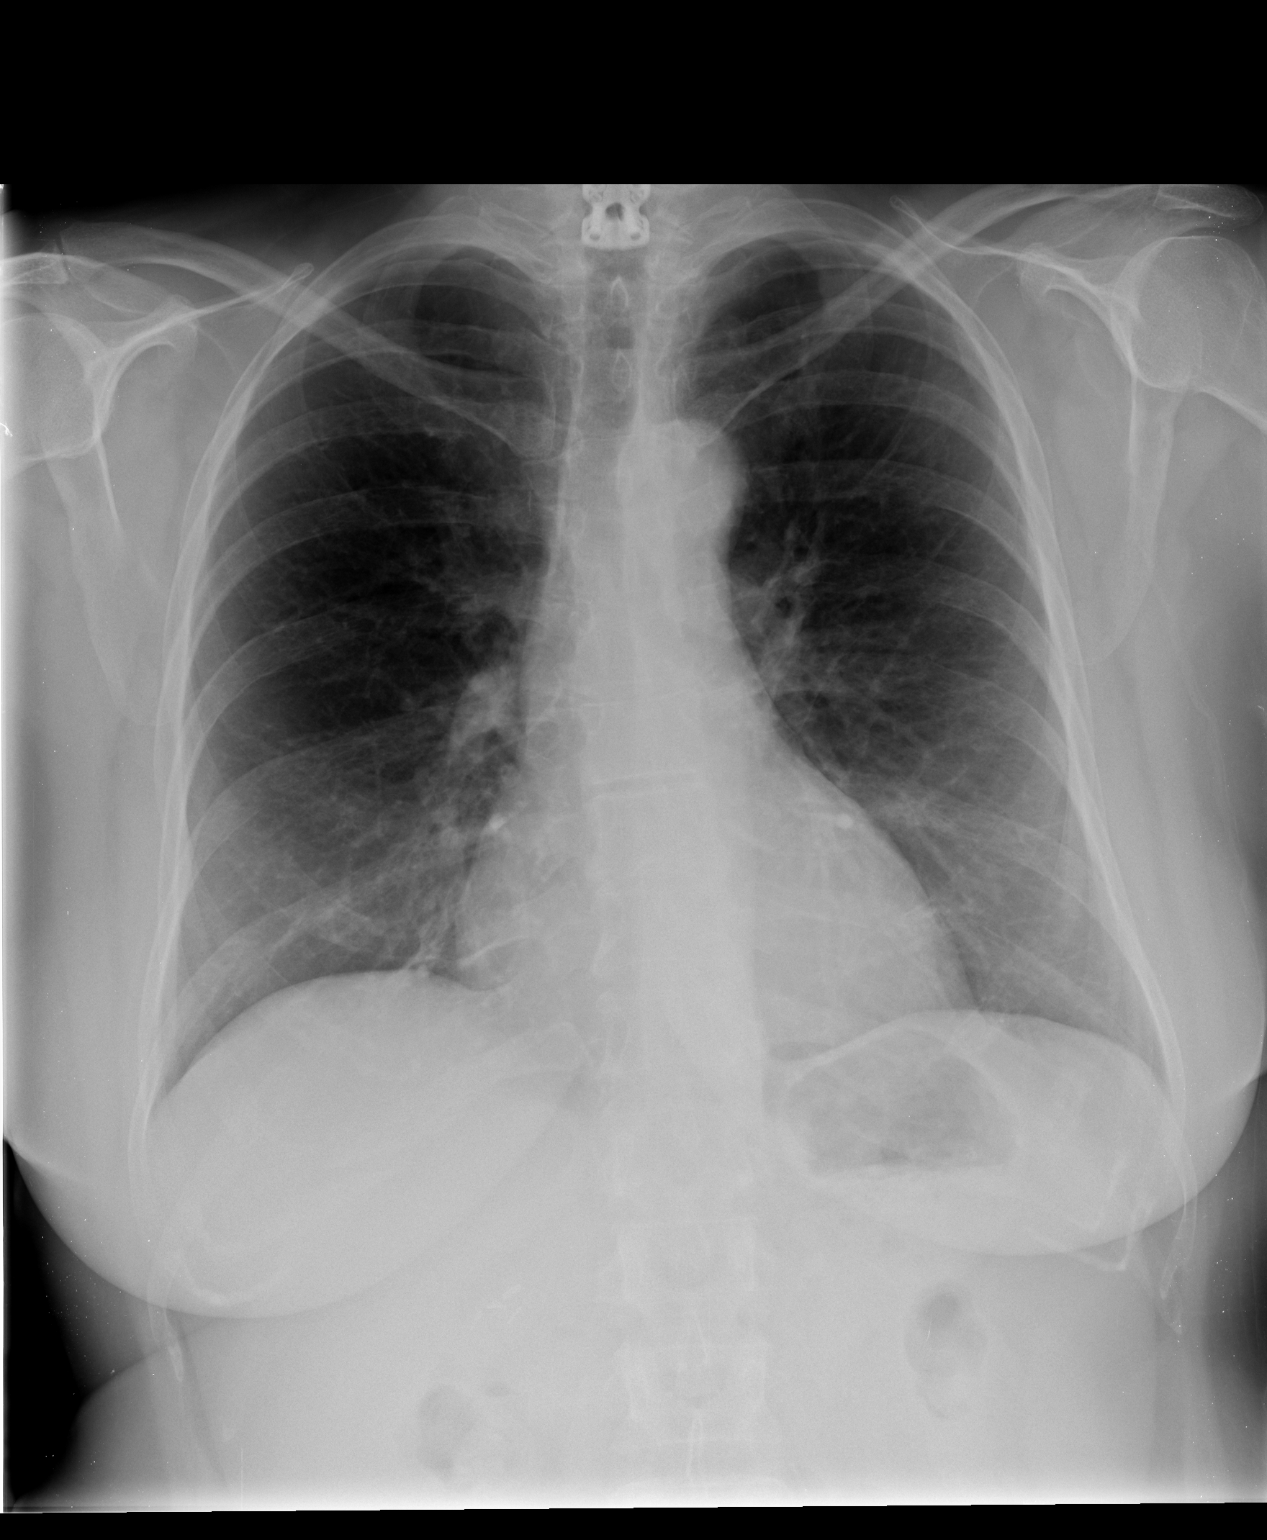

[view not recorded (2 of 2)]
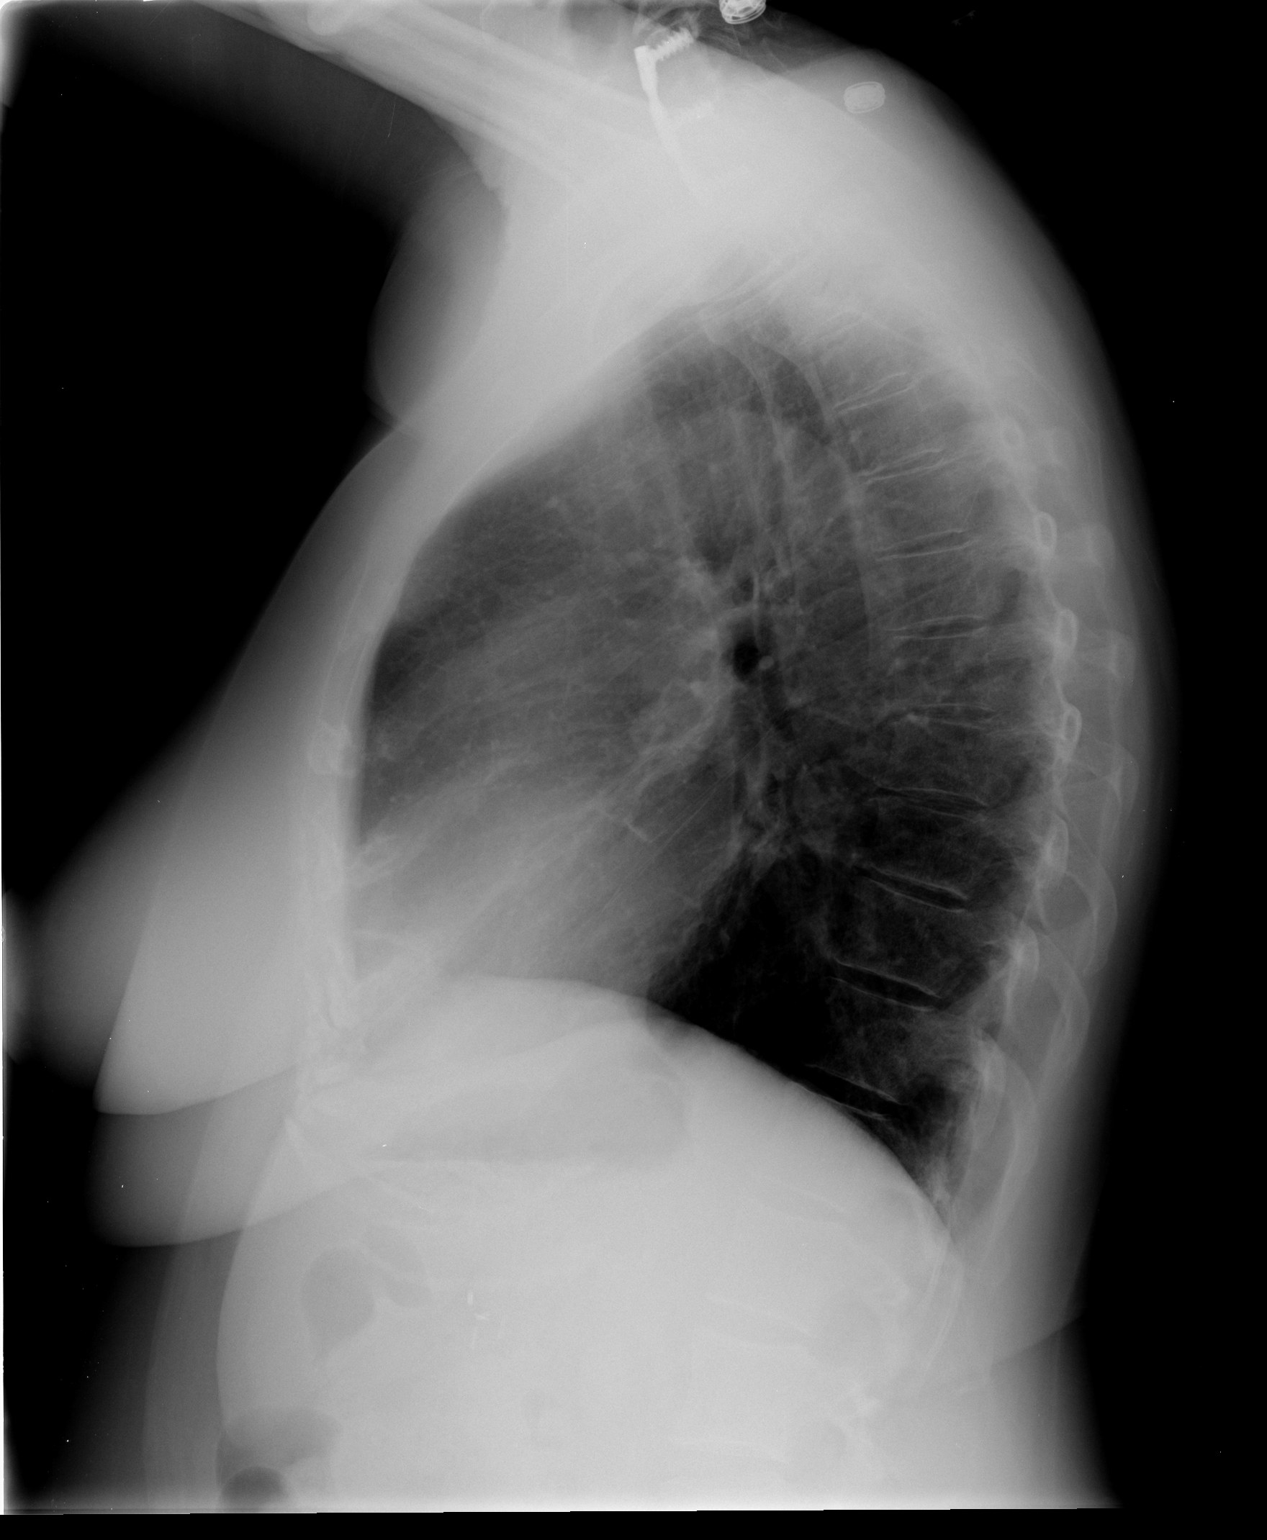

[2 of 2 positions shown; findings below may reference images not displayed]

FINDINGS: Slight hyperinflation of the lungs.  Heart is upper
limits normal in size.  No confluent opacities or effusions.  No
acute bony abnormality.
IMPRESSION: No active cardiopulmonary disease.

## 2011-11-17 ENCOUNTER — Other Ambulatory Visit: Payer: Self-pay | Admitting: Thoracic Surgery (Cardiothoracic Vascular Surgery)

## 2012-01-27 IMAGING — CR DG CHEST 2V
2 series · 2 of 2 positions shown · non-contrast
Comparison: None.

CLINICAL DATA: Chest pain, history of valvular heart disease.

CHEST - 2 VIEW

[view not recorded (1 of 2)]
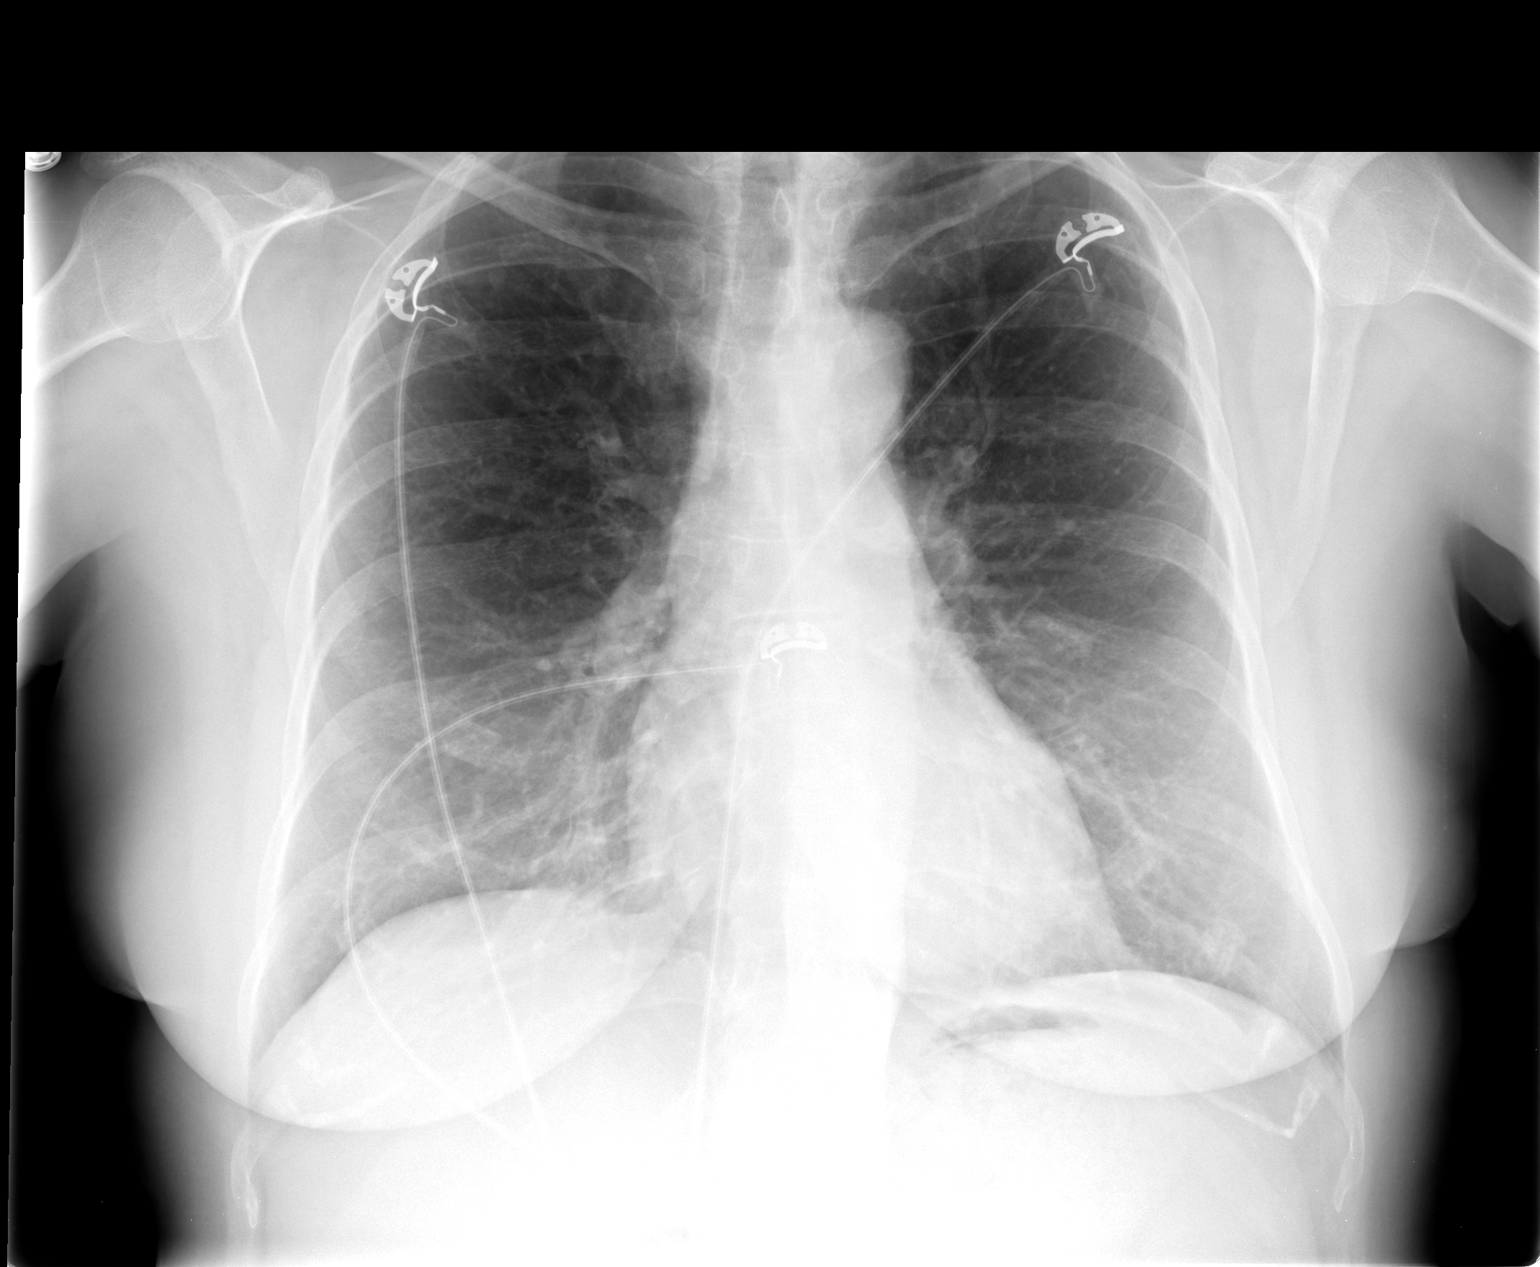

[view not recorded (2 of 2)]
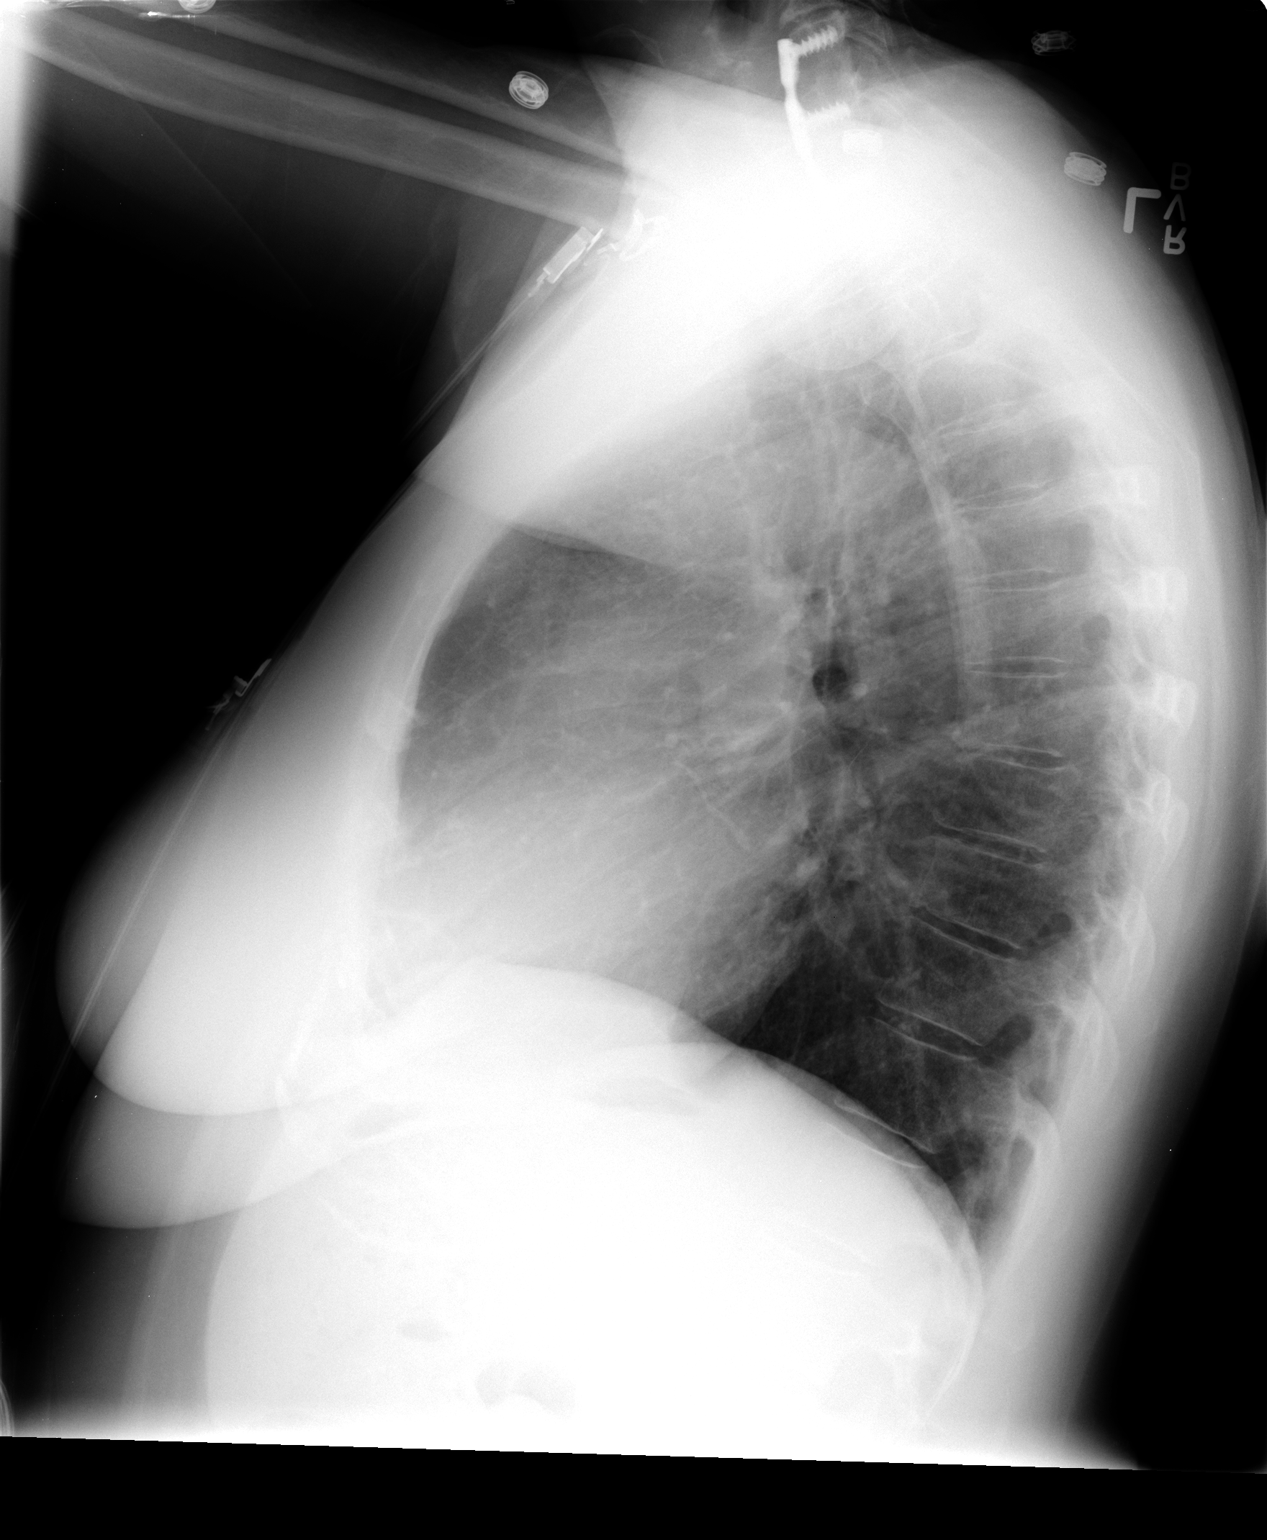

[2 of 2 positions shown; findings below may reference images not displayed]

FINDINGS: Normal heart size, mediastinal contours, and pulmonary vascularity.
Peribronchial thickening.
No pulmonary infiltrate, pleural effusion or pneumothorax.
No acute osseous findings.
Prior cervical spine fusion.
IMPRESSION: Bronchitic changes.

## 2012-01-30 IMAGING — CR DG CHEST 1V PORT
1 series · 1 of 1 positions shown · non-contrast
Comparison: 08/02/2011

CLINICAL DATA: CABG

PORTABLE CHEST - 1 VIEW

[view not recorded]
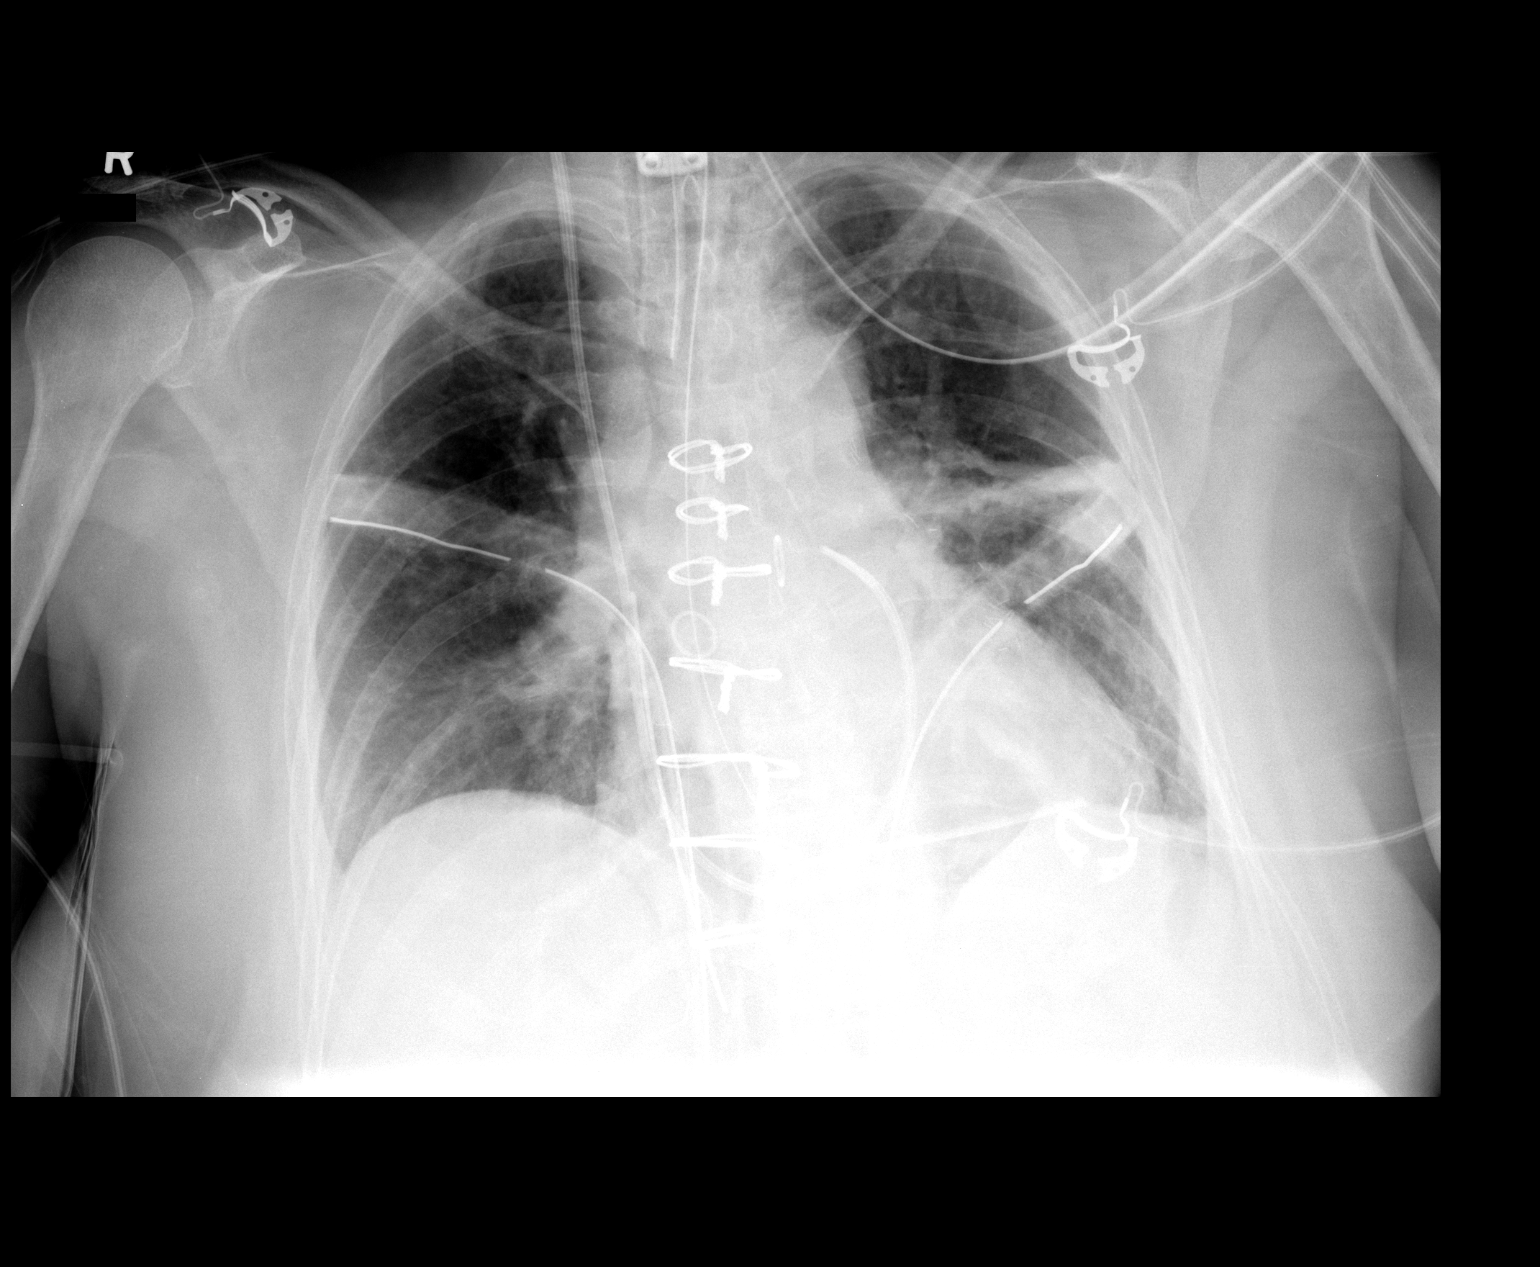

[1 of 1 positions shown; findings below may reference images not displayed]

FINDINGS: Postop CABG.  Endotracheal tube in good position.  Swan-
Ganz catheter in the main pulmonary artery.  Pericardial drain
overlies the heart.  Bilateral chest tubes in good position without
pneumothorax.

Mild bibasilar atelectasis.  Negative for edema
IMPRESSION: Postop changes from CABG.  Bibasilar atelectasis.  Negative for
edema or pneumothorax.

## 2012-02-03 ENCOUNTER — Encounter: Payer: Self-pay | Admitting: Surgery

## 2012-02-07 ENCOUNTER — Encounter (HOSPITAL_COMMUNITY): Payer: Self-pay | Admitting: Pharmacy Technician

## 2012-02-07 ENCOUNTER — Other Ambulatory Visit: Payer: Self-pay | Admitting: *Deleted

## 2012-02-07 ENCOUNTER — Ambulatory Visit (INDEPENDENT_AMBULATORY_CARE_PROVIDER_SITE_OTHER): Payer: Medicaid Other | Admitting: Surgery

## 2012-02-07 ENCOUNTER — Encounter: Payer: Self-pay | Admitting: Surgery

## 2012-02-07 VITALS — BP 130/58 | HR 67 | Temp 98.0°F | Ht 64.0 in | Wt 147.0 lb

## 2012-02-07 DIAGNOSIS — I6529 Occlusion and stenosis of unspecified carotid artery: Secondary | ICD-10-CM

## 2012-02-07 NOTE — Progress Notes (Signed)
[facility]   Patient name: [name] MRN: [id number] DOB: 09/28/1956 Sex: female   Referred by: Dr. Berry  Reason for referral:  Chief Complaint  Patient presents with  . Carotid    New Pt, 70-99% carotid stenosis- Dr. Jonathan Berry    HISTORY OF PRESENT ILLNESS: The patient comes in today with a new problem of right carotid stenosis. She has previously seen Dr. Lawson for aortoiliac occlusive disease. She was scheduled for aortobifemoral bypass graft last summer however failed her cardiac evaluation and has just recently undergone coronary artery bypass grafting by Dr. Owen.  She had a carotid duplex on 01/20/2012 which revealed a high-grade right carotid stenosis, greater than 70%. Velocity profile was 452/188. There was 50-69% stenosis on the left. Currently, the patient denies symptoms. She denies slurred speech. She denies amaurosis fugax. She denies numbness or weakness in either extremity.  The patient is recovering from her heart surgery. She no longer has any discomfort. Her incisions have all healed. She still complains of fatigue.  Past Medical History  Diagnosis Date  . Aortic aneurysm   . Depression   . Anxiety   . Acid reflux   . Claudication   . DJD (degenerative joint disease) of lumbar spine   . Coronary artery disease   . Peripheral vascular disease   . Myocardial infarction 08/05/2011  . AAA (abdominal aortic aneurysm)     Past Surgical History  Procedure Date  . Cholecystectomy   . Abdominal hysterectomy   . Spine surgery 2000 & 2002    lumbar laminectiomies  . Aortogram with runoff 04/27/2011    Bilateral runoff by Dr. Breeanna Galgano  . Coronary artery bypass graft 08/05/2011    CABG x3 using LIMA to D1, SVG to OM, SVG to PDA, open vein harvest bilateral thighs  . Back surgery     2000, 2002    History   Social History  . Marital Status: Divorced    Spouse Name: N/A    Number of Children: N/A  . Years of Education:  N/A   Occupational History  . Not on file.   Social History Main Topics  . Smoking status: Former Smoker -- 0.5 packs/day for 30 years    Types: Cigarettes    Quit date: 08/05/2011  . Smokeless tobacco: Not on file  . Alcohol Use: No  . Drug Use: No  . Sexually Active: Not on file   Other Topics Concern  . Not on file   Social History Narrative  . No narrative on file    Family History  Problem Relation Age of Onset  . Diabetes Mother   . Diabetes Sister   . Heart disease Father   . Heart attack Father     Allergies as of 02/07/2012 - Review Complete 02/07/2012  Allergen Reaction Noted  . Celebrex (celecoxib) Swelling 04/10/2011  . Eggs or egg-derived products      Current Outpatient Prescriptions on File Prior to Visit  Medication Sig Dispense Refill  . aspirin 325 MG EC tablet Take 1 tablet (325 mg total) by mouth daily.  30 tablet  1  . buPROPion (WELLBUTRIN SR) 150 MG 12 hr tablet Take 1 tablet (150 mg total) by mouth 2 (two) times daily.  30 tablet  0  . fenofibrate 160 MG tablet Take 1 tablet (160 mg total) by mouth daily.  30 tablet  1  . lisinopril (PRINIVIL,ZESTRIL) 10 MG tablet Take 1 tablet (10 mg total)   by mouth daily.  30 tablet  1  . metoprolol tartrate (LOPRESSOR) 12.5 mg TABS Take 0.5 tablets (12.5 mg total) by mouth 2 (two) times daily.  30 tablet  1  . nicotine (NICODERM CQ - DOSED IN MG/24 HOURS) 21 mg/24hr patch Place 1 patch onto the skin daily.        . omeprazole (PRILOSEC) 40 MG capsule Take 40 mg by mouth daily.        . polysaccharide iron (NIFEREX) 150 MG CAPS capsule Take 1 capsule (150 mg total) by mouth daily. for one month then stop.  30 each  0  . rosuvastatin (CRESTOR) 20 MG tablet Take 1 tablet (20 mg total) by mouth daily at 6 PM.  30 tablet  1  . traMADol (ULTRAM) 50 MG tablet TAKE 1-2 TABLET EVERY 6 HOURS AS NEEDED FOR PAIN  45 tablet  0     REVIEW OF SYSTEMS: Positive for generalized weakness. Positive for claudication. Negative  for chest pain. Negative for shortness of breath. All other systems are negative as documented in the patient filled out to encounter form  PHYSICAL EXAMINATION: General: The patient appears their stated age.  Vital signs are BP 130/58  Pulse 67  Temp(Src) 98 F (36.7 C) (Oral)  Ht 5' 4" (1.626 m)  Wt 147 lb (66.679 kg)  BMI 25.23 kg/m2  SpO2 100% HEENT:  No gross abnormalities Pulmonary: Respirations are non-labored Abdomen: Soft and non-tender  Musculoskeletal: There are no major deformities.   Neurologic: No focal weakness or paresthesias are detected, Skin: There are no ulcer or rashes noted. Psychiatric: The patient has normal affect. Cardiovascular: There is a regular rate and rhythm without significant murmur appreciated. Bilateral carotid bruits  Diagnostic Studies: I have reviewed her ultrasound which shows greater than 70% right carotid stenosis with a velocity profile of 452/188 on the right. Left-sided stenosis is in the 50-69% category    Assessment:  Asymptomatic right carotid stenosis Plan: I have discussed the risks and benefits of carotid endarterectomy. We discussed the risk of stroke, the risk of nerve injury, the risk of infection, the risk of cardiopulmonary complications. We also discussed the recovery period. The patient has previously been seen and evaluated by Dr. Lawson and therefore I am going to have him see and evaluate the patient said that he can proceed with carotid endarterectomy.  Dr. Lawson has seen and evaluated the patient and agrees with proceeding with right carotid endarterectomy. This has been scheduled for this Thursday, May 9. All the patient's questions were answered.    V. Wells Arman Loy IV, M.D. [facility] [place] Office: [phone] Pager:  [phone]   

## 2012-02-09 ENCOUNTER — Encounter (HOSPITAL_COMMUNITY): Payer: Self-pay

## 2012-02-09 ENCOUNTER — Encounter (HOSPITAL_COMMUNITY)
Admission: RE | Admit: 2012-02-09 | Discharge: 2012-02-09 | Disposition: A | Discharge status: Home / Self Care | Payer: Medicaid Other | Source: Ambulatory Visit | Attending: Vascular Surgery | Admitting: Vascular Surgery

## 2012-02-09 HISTORY — DX: Occlusion and stenosis of unspecified carotid artery: I65.29

## 2012-02-09 LAB — CBC
HCT: 39.3 % (ref 36.0–46.0)
Hemoglobin: 12.7 g/dL (ref 12.0–15.0)
MCV: 83.4 fL (ref 78.0–100.0)
RBC: 4.71 MIL/uL (ref 3.87–5.11)
WBC: 11.2 10*3/uL — ABNORMAL HIGH (ref 4.0–10.5)

## 2012-02-09 LAB — COMPREHENSIVE METABOLIC PANEL
AST: 18 U/L (ref 0–37)
BUN: 20 mg/dL (ref 6–23)
CO2: 23 mEq/L (ref 19–32)
Chloride: 107 mEq/L (ref 96–112)
Creatinine, Ser: 0.93 mg/dL (ref 0.50–1.10)
GFR calc non Af Amer: 67 mL/min — ABNORMAL LOW (ref >90)
Total Bilirubin: 0.1 mg/dL — ABNORMAL LOW (ref 0.3–1.2)

## 2012-02-09 LAB — URINE MICROSCOPIC-ADD ON

## 2012-02-09 LAB — URINALYSIS, ROUTINE W REFLEX MICROSCOPIC
Glucose, UA: NEGATIVE mg/dL
Hgb urine dipstick: NEGATIVE
Protein, ur: NEGATIVE mg/dL
pH: 5 (ref 5.0–8.0)

## 2012-02-09 LAB — SURGICAL PCR SCREEN
MRSA, PCR: POSITIVE — AB
Staphylococcus aureus: POSITIVE — AB

## 2012-02-09 MED ORDER — DEXTROSE 5 % IV SOLN
1.5000 g | INTRAVENOUS | Status: AC
  Administered 2012-02-10: 1.5 g via INTRAVENOUS
  Filled 2012-02-09: qty 1.5

## 2012-02-09 NOTE — Pre-Procedure Instructions (Addendum)
20 [name]  02/09/2012   Your procedure is scheduled on:  MAY 9 @ 0730  Report to [facility] at 0530 AM.  Call this number if you have problems the morning of surgery: [phone]   Remember:   Do not eat food:After Midnight.  May have clear liquids: up to 4 Hours before arrival.  Clear liquids include soda, tea, black coffee, apple or grape juice, broth.  Take these medicines the morning of surgery with A SIP OF WATER: METOPROLOL, PRILOSEC   Do not wear jewelry, make-up or nail polish.  Do not wear lotions, powders, or perfumes. You may wear deodorant.  Do not shave 48 hours prior to surgery.  Do not bring valuables to the hospital.  Contacts, dentures or bridgework may not be worn into surgery.  Leave suitcase in the car. After surgery it may be brought to your room.  For patients admitted to the hospital, checkout time is 11:00 AM the day of discharge.   Patients discharged the day of surgery will not be allowed to drive home.  Name and phone number of your driver: BRYANT  Special Instructions: CHG Shower Use Special Wash: 1/2 bottle night before surgery and 1/2 bottle morning of surgery.   Please read over the following fact sheets that you were given: Pain Booklet, Coughing and Deep Breathing, Blood Transfusion Information, MRSA Information and Surgical Site Infection Prevention

## 2012-02-10 ENCOUNTER — Encounter (HOSPITAL_COMMUNITY): Payer: Self-pay

## 2012-02-10 ENCOUNTER — Inpatient Hospital Stay (HOSPITAL_COMMUNITY)
Admission: RE | Admit: 2012-02-10 | Discharge: 2012-02-12 | DRG: 039 | Disposition: A | Discharge status: Home / Self Care | Payer: Medicaid Other | Source: Ambulatory Visit | Attending: Vascular Surgery | Admitting: Vascular Surgery

## 2012-02-10 ENCOUNTER — Encounter (HOSPITAL_COMMUNITY)
Admission: RE | Disposition: A | Discharge status: Home / Self Care | Payer: Self-pay | Source: Ambulatory Visit | Attending: Vascular Surgery

## 2012-02-10 ENCOUNTER — Ambulatory Visit (HOSPITAL_COMMUNITY): Payer: Medicaid Other | Admitting: Anesthesiology

## 2012-02-10 ENCOUNTER — Encounter (HOSPITAL_COMMUNITY): Payer: Self-pay | Admitting: Anesthesiology

## 2012-02-10 DIAGNOSIS — Z01812 Encounter for preprocedural laboratory examination: Secondary | ICD-10-CM

## 2012-02-10 DIAGNOSIS — I714 Abdominal aortic aneurysm, without rupture, unspecified: Secondary | ICD-10-CM | POA: Diagnosis present

## 2012-02-10 DIAGNOSIS — I252 Old myocardial infarction: Secondary | ICD-10-CM

## 2012-02-10 DIAGNOSIS — I2510 Atherosclerotic heart disease of native coronary artery without angina pectoris: Secondary | ICD-10-CM | POA: Diagnosis present

## 2012-02-10 DIAGNOSIS — I6523 Occlusion and stenosis of bilateral carotid arteries: Secondary | ICD-10-CM

## 2012-02-10 DIAGNOSIS — M47817 Spondylosis without myelopathy or radiculopathy, lumbosacral region: Secondary | ICD-10-CM | POA: Diagnosis present

## 2012-02-10 DIAGNOSIS — I6529 Occlusion and stenosis of unspecified carotid artery: Principal | ICD-10-CM | POA: Diagnosis present

## 2012-02-10 DIAGNOSIS — Z951 Presence of aortocoronary bypass graft: Secondary | ICD-10-CM

## 2012-02-10 HISTORY — PX: ENDARTERECTOMY: SHX5162

## 2012-02-10 HISTORY — PX: CAROTID ENDARTERECTOMY: SUR193

## 2012-02-10 LAB — CBC
HCT: 31.8 % — ABNORMAL LOW (ref 36.0–46.0)
Hemoglobin: 10.1 g/dL — ABNORMAL LOW (ref 12.0–15.0)
MCH: 26.4 pg (ref 26.0–34.0)
RBC: 3.82 MIL/uL — ABNORMAL LOW (ref 3.87–5.11)

## 2012-02-10 LAB — APTT: aPTT: 49 seconds — ABNORMAL HIGH (ref 24–37)

## 2012-02-10 SURGERY — ENDARTERECTOMY, CAROTID
Anesthesia: General | Site: Neck | Laterality: Right | Wound class: Clean

## 2012-02-10 MED ORDER — ETOMIDATE 2 MG/ML IV SOLN
INTRAVENOUS | Status: DC | PRN
  Administered 2012-02-10: 20 mg via INTRAVENOUS

## 2012-02-10 MED ORDER — ONDANSETRON HCL 4 MG/2ML IJ SOLN
4.0000 mg | Freq: Four times a day (QID) | INTRAMUSCULAR | Status: DC | PRN
  Administered 2012-02-10 – 2012-02-11 (×2): 4 mg via INTRAVENOUS
  Filled 2012-02-10: qty 2

## 2012-02-10 MED ORDER — PHENOL 1.4 % MT LIQD: 1.0000 | OROMUCOSAL | Status: DC | PRN

## 2012-02-10 MED ORDER — METOPROLOL TARTRATE 12.5 MG HALF TABLET
12.5000 mg | ORAL_TABLET | Freq: Two times a day (BID) | ORAL | Status: DC
  Administered 2012-02-10 – 2012-02-12 (×4): 12.5 mg via ORAL
  Filled 2012-02-10 (×5): qty 1

## 2012-02-10 MED ORDER — ASPIRIN EC 325 MG PO TBEC
325.0000 mg | DELAYED_RELEASE_TABLET | Freq: Every day | ORAL | Status: DC
  Administered 2012-02-10 – 2012-02-12 (×3): 325 mg via ORAL
  Filled 2012-02-10 (×3): qty 1

## 2012-02-10 MED ORDER — ALUM & MAG HYDROXIDE-SIMETH 200-200-20 MG/5ML PO SUSP: 15.0000 mL | ORAL | Status: DC | PRN

## 2012-02-10 MED ORDER — GUAIFENESIN-DM 100-10 MG/5ML PO SYRP: 15.0000 mL | ORAL_SOLUTION | ORAL | Status: DC | PRN

## 2012-02-10 MED ORDER — PROTAMINE SULFATE 10 MG/ML IV SOLN
INTRAVENOUS | Status: DC | PRN
  Administered 2012-02-10: 50 mg via INTRAVENOUS

## 2012-02-10 MED ORDER — MORPHINE SULFATE 4 MG/ML IJ SOLN: 0.0500 mg/kg | INTRAMUSCULAR | Status: DC | PRN

## 2012-02-10 MED ORDER — NITROPRUSSIDE SODIUM 25 MG/ML IV SOLN
0.2500 ug/kg/min | INTRAVENOUS | Status: DC | PRN
  Filled 2012-02-10: qty 2

## 2012-02-10 MED ORDER — PHENYLEPHRINE HCL 10 MG/ML IJ SOLN
10.0000 mg | INTRAVENOUS | Status: DC | PRN
  Administered 2012-02-10 (×2): 10 ug/min via INTRAVENOUS

## 2012-02-10 MED ORDER — 0.9 % SODIUM CHLORIDE (POUR BTL) OPTIME
TOPICAL | Status: DC | PRN
  Administered 2012-02-10: 2000 mL

## 2012-02-10 MED ORDER — SENNOSIDES-DOCUSATE SODIUM 8.6-50 MG PO TABS
1.0000 | ORAL_TABLET | Freq: Every evening | ORAL | Status: DC | PRN
  Filled 2012-02-10: qty 1

## 2012-02-10 MED ORDER — ONDANSETRON HCL 4 MG/2ML IJ SOLN
INTRAMUSCULAR | Status: DC | PRN
  Administered 2012-02-10: 4 mg via INTRAVENOUS

## 2012-02-10 MED ORDER — GLYCOPYRROLATE 0.2 MG/ML IJ SOLN
INTRAMUSCULAR | Status: DC | PRN
  Administered 2012-02-10: .7 mg via INTRAVENOUS

## 2012-02-10 MED ORDER — ESMOLOL HCL 10 MG/ML IV SOLN
INTRAVENOUS | Status: DC | PRN
  Administered 2012-02-10: 10 mg via INTRAVENOUS
  Administered 2012-02-10: 20 mg via INTRAVENOUS
  Administered 2012-02-10 (×2): 10 mg via INTRAVENOUS

## 2012-02-10 MED ORDER — ONDANSETRON HCL 4 MG/2ML IJ SOLN: 4.0000 mg | Freq: Once | INTRAMUSCULAR | Status: DC | PRN

## 2012-02-10 MED ORDER — SODIUM CHLORIDE 0.9 % IV SOLN
INTRAVENOUS | Status: DC
  Administered 2012-02-10 – 2012-02-11 (×4): via INTRAVENOUS

## 2012-02-10 MED ORDER — NEOSTIGMINE METHYLSULFATE 1 MG/ML IJ SOLN
INTRAMUSCULAR | Status: DC | PRN
  Administered 2012-02-10: 5 mg via INTRAVENOUS

## 2012-02-10 MED ORDER — MORPHINE SULFATE 2 MG/ML IJ SOLN
2.0000 mg | INTRAMUSCULAR | Status: DC | PRN
  Administered 2012-02-10 – 2012-02-12 (×5): 2 mg via INTRAVENOUS
  Filled 2012-02-10 (×5): qty 1

## 2012-02-10 MED ORDER — ACETAMINOPHEN 325 MG PO TABS: 325.0000 mg | ORAL_TABLET | ORAL | Status: DC | PRN

## 2012-02-10 MED ORDER — PANTOPRAZOLE SODIUM 40 MG PO TBEC
40.0000 mg | DELAYED_RELEASE_TABLET | Freq: Every day | ORAL | Status: DC
  Filled 2012-02-10: qty 1

## 2012-02-10 MED ORDER — PANTOPRAZOLE SODIUM 40 MG PO TBEC: 40.0000 mg | DELAYED_RELEASE_TABLET | Freq: Every day | ORAL | Status: DC

## 2012-02-10 MED ORDER — DOCUSATE SODIUM 100 MG PO CAPS
100.0000 mg | ORAL_CAPSULE | Freq: Every day | ORAL | Status: DC
  Administered 2012-02-11 – 2012-02-12 (×2): 100 mg via ORAL
  Filled 2012-02-10 (×2): qty 1

## 2012-02-10 MED ORDER — HYDROMORPHONE HCL PF 1 MG/ML IJ SOLN
0.2500 mg | INTRAMUSCULAR | Status: DC | PRN
  Administered 2012-02-10: 0.5 mg via INTRAVENOUS

## 2012-02-10 MED ORDER — HYDRALAZINE HCL 20 MG/ML IJ SOLN: 10.0000 mg | INTRAMUSCULAR | Status: DC | PRN

## 2012-02-10 MED ORDER — ASPIRIN EC 81 MG PO TBEC: 81.0000 mg | DELAYED_RELEASE_TABLET | Freq: Every day | ORAL | Status: DC

## 2012-02-10 MED ORDER — FENTANYL CITRATE 0.05 MG/ML IJ SOLN
INTRAMUSCULAR | Status: DC | PRN
  Administered 2012-02-10 (×2): 50 ug via INTRAVENOUS
  Administered 2012-02-10: 100 ug via INTRAVENOUS

## 2012-02-10 MED ORDER — OXYCODONE-ACETAMINOPHEN 5-325 MG PO TABS
1.0000 | ORAL_TABLET | ORAL | Status: DC | PRN
  Administered 2012-02-11 (×4): 1 via ORAL
  Administered 2012-02-12: 2 via ORAL
  Filled 2012-02-10 (×2): qty 1
  Filled 2012-02-10: qty 2
  Filled 2012-02-10 (×2): qty 1

## 2012-02-10 MED ORDER — MAGNESIUM SULFATE 40 MG/ML IJ SOLN: 2.0000 g | Freq: Once | INTRAMUSCULAR | Status: DC | PRN

## 2012-02-10 MED ORDER — POTASSIUM CHLORIDE CRYS ER 20 MEQ PO TBCR: 20.0000 meq | EXTENDED_RELEASE_TABLET | Freq: Once | ORAL | Status: DC | PRN

## 2012-02-10 MED ORDER — SODIUM CHLORIDE 0.9 % IV SOLN: 500.0000 mL | Freq: Once | INTRAVENOUS | Status: AC | PRN

## 2012-02-10 MED ORDER — METOPROLOL TARTRATE 1 MG/ML IV SOLN
2.0000 mg | INTRAVENOUS | Status: DC | PRN
  Filled 2012-02-10: qty 5

## 2012-02-10 MED ORDER — LACTATED RINGERS IV SOLN
INTRAVENOUS | Status: DC | PRN
  Administered 2012-02-10 (×2): via INTRAVENOUS

## 2012-02-10 MED ORDER — ROCURONIUM BROMIDE 100 MG/10ML IV SOLN
INTRAVENOUS | Status: DC | PRN
  Administered 2012-02-10: 50 mg via INTRAVENOUS
  Administered 2012-02-10: 10 mg via INTRAVENOUS

## 2012-02-10 MED ORDER — ACETAMINOPHEN 650 MG RE SUPP: 325.0000 mg | RECTAL | Status: DC | PRN

## 2012-02-10 MED ORDER — LIDOCAINE HCL 4 % MT SOLN
OROMUCOSAL | Status: DC | PRN
  Administered 2012-02-10: 4 mL via TOPICAL

## 2012-02-10 MED ORDER — SODIUM CHLORIDE 0.9 % IR SOLN
Status: DC | PRN
  Administered 2012-02-10: 09:00:00

## 2012-02-10 MED ORDER — ATORVASTATIN CALCIUM 20 MG PO TABS
20.0000 mg | ORAL_TABLET | Freq: Every day | ORAL | Status: DC
  Administered 2012-02-10 – 2012-02-11 (×2): 20 mg via ORAL
  Filled 2012-02-10 (×3): qty 1

## 2012-02-10 MED ORDER — ALBUMIN HUMAN 5 % IV SOLN
INTRAVENOUS | Status: DC | PRN
  Administered 2012-02-10 (×2): via INTRAVENOUS

## 2012-02-10 MED ORDER — CHLORHEXIDINE GLUCONATE CLOTH 2 % EX PADS
6.0000 | MEDICATED_PAD | Freq: Every day | CUTANEOUS | Status: DC
  Administered 2012-02-11 – 2012-02-12 (×2): 6 via TOPICAL

## 2012-02-10 MED ORDER — HEPARIN SODIUM (PORCINE) 1000 UNIT/ML IJ SOLN
INTRAMUSCULAR | Status: DC | PRN
  Administered 2012-02-10: 6000 [IU] via INTRAVENOUS
  Administered 2012-02-10: 2000 [IU] via INTRAVENOUS

## 2012-02-10 MED ORDER — MUPIROCIN 2 % EX OINT
1.0000 "application " | TOPICAL_OINTMENT | Freq: Two times a day (BID) | CUTANEOUS | Status: DC
  Administered 2012-02-10 – 2012-02-12 (×4): 1 via NASAL
  Filled 2012-02-10: qty 22

## 2012-02-10 MED ORDER — HEPARIN (PORCINE) IN NACL 100-0.45 UNIT/ML-% IJ SOLN
500.0000 [IU]/h | INTRAMUSCULAR | Status: DC
  Administered 2012-02-10: 500 [IU]/h via INTRAVENOUS
  Filled 2012-02-10: qty 250

## 2012-02-10 MED ORDER — BISACODYL 5 MG PO TBEC: 5.0000 mg | DELAYED_RELEASE_TABLET | Freq: Every day | ORAL | Status: DC | PRN

## 2012-02-10 MED ORDER — SODIUM CHLORIDE 0.9 % IV SOLN: INTRAVENOUS | Status: DC

## 2012-02-10 MED ORDER — DOPAMINE-DEXTROSE 3.2-5 MG/ML-% IV SOLN: 3.0000 ug/kg/min | INTRAVENOUS | Status: DC

## 2012-02-10 MED ORDER — LABETALOL HCL 5 MG/ML IV SOLN: 10.0000 mg | INTRAVENOUS | Status: DC | PRN

## 2012-02-10 MED ORDER — DEXTROSE 5 % IV SOLN
1.5000 g | Freq: Two times a day (BID) | INTRAVENOUS | Status: AC
  Administered 2012-02-10 – 2012-02-11 (×2): 1.5 g via INTRAVENOUS
  Filled 2012-02-10 (×3): qty 1.5

## 2012-02-10 SURGICAL SUPPLY — 53 items
CANISTER SUCTION 2500CC (MISCELLANEOUS) ×2 IMPLANT
CATH EMB 2FR 60CM (CATHETERS) ×2 IMPLANT
CATH EMB 3FR 40CM (CATHETERS) ×2 IMPLANT
CATH ROBINSON RED A/P 18FR (CATHETERS) ×2 IMPLANT
CATH SUCT 10FR WHISTLE TIP (CATHETERS) ×2 IMPLANT
CLIP TI MEDIUM 24 (CLIP) ×2 IMPLANT
CLIP TI WIDE RED SMALL 24 (CLIP) ×2 IMPLANT
CLOTH BEACON ORANGE TIMEOUT ST (SAFETY) ×2 IMPLANT
COVER SURGICAL LIGHT HANDLE (MISCELLANEOUS) ×4 IMPLANT
CRADLE DONUT ADULT HEAD (MISCELLANEOUS) ×2 IMPLANT
DECANTER SPIKE VIAL GLASS SM (MISCELLANEOUS) IMPLANT
DRAIN HEMOVAC 1/8 X 5 (WOUND CARE) IMPLANT
DRAIN JACKSON PRT FLT 10 (DRAIN) ×2 IMPLANT
DRAPE WARM FLUID 44X44 (DRAPE) ×2 IMPLANT
DRSG COVADERM 4X8 (GAUZE/BANDAGES/DRESSINGS) ×2 IMPLANT
ELECT REM PT RETURN 9FT ADLT (ELECTROSURGICAL) ×2
ELECTRODE REM PT RTRN 9FT ADLT (ELECTROSURGICAL) ×1 IMPLANT
EVACUATOR SILICONE 100CC (DRAIN) ×2 IMPLANT
GEL ULTRASOUND 20GR AQUASONIC (MISCELLANEOUS) ×2 IMPLANT
GLOVE BIO SURGEON STRL SZ 6.5 (GLOVE) ×2 IMPLANT
GLOVE BIOGEL PI IND STRL 6.5 (GLOVE) ×1 IMPLANT
GLOVE BIOGEL PI IND STRL 7.0 (GLOVE) ×2 IMPLANT
GLOVE BIOGEL PI INDICATOR 6.5 (GLOVE) ×1
GLOVE BIOGEL PI INDICATOR 7.0 (GLOVE) ×2
GLOVE SS BIOGEL STRL SZ 7 (GLOVE) ×2 IMPLANT
GLOVE SUPERSENSE BIOGEL SZ 7 (GLOVE) ×2
GOWN BRE IMP PREV XXLGXLNG (GOWN DISPOSABLE) ×2 IMPLANT
GOWN STRL NON-REIN LRG LVL3 (GOWN DISPOSABLE) ×6 IMPLANT
GOWN STRL REIN XL XLG (GOWN DISPOSABLE) ×2 IMPLANT
INSERT FOGARTY SM (MISCELLANEOUS) ×2 IMPLANT
KIT BASIN OR (CUSTOM PROCEDURE TRAY) ×2 IMPLANT
KIT CATH SUCT 8FR (CATHETERS) ×2 IMPLANT
KIT ROOM TURNOVER OR (KITS) ×2 IMPLANT
MARKER SKIN DUAL TIP RULER LAB (MISCELLANEOUS) ×2 IMPLANT
NEEDLE 22X1 1/2 (OR ONLY) (NEEDLE) IMPLANT
NS IRRIG 1000ML POUR BTL (IV SOLUTION) ×4 IMPLANT
PACK CAROTID (CUSTOM PROCEDURE TRAY) ×2 IMPLANT
PAD ARMBOARD 7.5X6 YLW CONV (MISCELLANEOUS) ×4 IMPLANT
PATCH HEMASHIELD 8X75 (Vascular Products) ×2 IMPLANT
SHUNT CAROTID BYPASS 12FRX15.5 (VASCULAR PRODUCTS) IMPLANT
SPECIMEN JAR SMALL (MISCELLANEOUS) ×2 IMPLANT
SUT PROLENE 6 0 CC (SUTURE) ×10 IMPLANT
SUT PROLENE 7 0 BV 1 (SUTURE) ×2 IMPLANT
SUT SILK 2 0 FS (SUTURE) ×2 IMPLANT
SUT VIC AB 2-0 CT1 27 (SUTURE) ×1
SUT VIC AB 2-0 CT1 TAPERPNT 27 (SUTURE) ×1 IMPLANT
SUT VIC AB 3-0 X1 27 (SUTURE) ×2 IMPLANT
SYR CONTROL 10ML LL (SYRINGE) IMPLANT
SYR TB 1ML LUER SLIP (SYRINGE) ×2 IMPLANT
TOWEL OR 17X24 6PK STRL BLUE (TOWEL DISPOSABLE) ×2 IMPLANT
TOWEL OR 17X26 10 PK STRL BLUE (TOWEL DISPOSABLE) ×2 IMPLANT
TUBE FEEDING 8FR 16IN STR KANG (MISCELLANEOUS) ×2 IMPLANT
WATER STERILE IRR 1000ML POUR (IV SOLUTION) ×2 IMPLANT

## 2012-02-10 NOTE — Interval H&P Note (Signed)
History and Physical Interval Note:  02/10/2012 8:26 AM  [name]  has presented today for surgery, with the diagnosis of ICA STENOSIS RIGHT  The various methods of treatment have been discussed with the patient and family. After consideration of risks, benefits and other options for treatment, the patient has consented to  Procedure(s) (LRB): ENDARTERECTOMY CAROTID (Right) as a surgical intervention .  The patients' history has been reviewed, patient examined, no change in status, stable for surgery.  I have reviewed the patients' chart and labs.  Questions were answered to the patient's satisfaction.     [name]

## 2012-02-10 NOTE — H&P (View-Only) (Signed)
[facility] [place]   Patient name: [name] MRN: [id number] DOB: 1955-11-18 Sex: female   Referred by: Dr. Allyson Sabal  Reason for referral:  Chief Complaint  Patient presents with  . Carotid    New Pt, 70-99% carotid stenosis- Dr. Nanetta Batty    HISTORY OF PRESENT ILLNESS: The patient comes in today with a new problem of right carotid stenosis. She has previously seen Dr. Hart Rochester for aortoiliac occlusive disease. She was scheduled for aortobifemoral bypass graft last summer however failed her cardiac evaluation and has just recently undergone coronary artery bypass grafting by Dr. Cornelius Moras.  She had a carotid duplex on 01/20/2012 which revealed a high-grade right carotid stenosis, greater than 70%. Velocity profile was 452/188. There was 50-69% stenosis on the left. Currently, the patient denies symptoms. She denies slurred speech. She denies amaurosis fugax. She denies numbness or weakness in either extremity.  The patient is recovering from her heart surgery. She no longer has any discomfort. Her incisions have all healed. She still complains of fatigue.  Past Medical History  Diagnosis Date  . Aortic aneurysm   . Depression   . Anxiety   . Acid reflux   . Claudication   . DJD (degenerative joint disease) of lumbar spine   . Coronary artery disease   . Peripheral vascular disease   . Myocardial infarction 08/05/2011  . AAA (abdominal aortic aneurysm)     Past Surgical History  Procedure Date  . Cholecystectomy   . Abdominal hysterectomy   . Spine surgery 2000 & 2002    lumbar laminectiomies  . Aortogram with runoff 04/27/2011    Bilateral runoff by Dr. Myra Gianotti  . Coronary artery bypass graft 08/05/2011    CABG x3 using LIMA to D1, SVG to OM, SVG to PDA, open vein harvest bilateral thighs  . Back surgery     2000, 2002    History   Social History  . Marital Status: Divorced    Spouse Name: N/A    Number of Children: N/A  . Years of Education:  N/A   Occupational History  . Not on file.   Social History Main Topics  . Smoking status: Former Smoker -- 0.5 packs/day for 30 years    Types: Cigarettes    Quit date: 08/05/2011  . Smokeless tobacco: Not on file  . Alcohol Use: No  . Drug Use: No  . Sexually Active: Not on file   Other Topics Concern  . Not on file   Social History Narrative  . No narrative on file    Family History  Problem Relation Age of Onset  . Diabetes Mother   . Diabetes Sister   . Heart disease Father   . Heart attack Father     Allergies as of 02/07/2012 - Review Complete 02/07/2012  Allergen Reaction Noted  . Celebrex (celecoxib) Swelling 04/10/2011  . Eggs or egg-derived products      Current Outpatient Prescriptions on File Prior to Visit  Medication Sig Dispense Refill  . aspirin 325 MG EC tablet Take 1 tablet (325 mg total) by mouth daily.  30 tablet  1  . buPROPion (WELLBUTRIN SR) 150 MG 12 hr tablet Take 1 tablet (150 mg total) by mouth 2 (two) times daily.  30 tablet  0  . fenofibrate 160 MG tablet Take 1 tablet (160 mg total) by mouth daily.  30 tablet  1  . lisinopril (PRINIVIL,ZESTRIL) 10 MG tablet Take 1 tablet (10 mg total)  by mouth daily.  30 tablet  1  . metoprolol tartrate (LOPRESSOR) 12.5 mg TABS Take 0.5 tablets (12.5 mg total) by mouth 2 (two) times daily.  30 tablet  1  . nicotine (NICODERM CQ - DOSED IN MG/24 HOURS) 21 mg/24hr patch Place 1 patch onto the skin daily.        Marland Kitchen omeprazole (PRILOSEC) 40 MG capsule Take 40 mg by mouth daily.        . polysaccharide iron (NIFEREX) 150 MG CAPS capsule Take 1 capsule (150 mg total) by mouth daily. for one month then stop.  30 each  0  . rosuvastatin (CRESTOR) 20 MG tablet Take 1 tablet (20 mg total) by mouth daily at 6 PM.  30 tablet  1  . traMADol (ULTRAM) 50 MG tablet TAKE 1-2 TABLET EVERY 6 HOURS AS NEEDED FOR PAIN  45 tablet  0     REVIEW OF SYSTEMS: Positive for generalized weakness. Positive for claudication. Negative  for chest pain. Negative for shortness of breath. All other systems are negative as documented in the patient filled out to encounter form  PHYSICAL EXAMINATION: General: The patient appears their stated age.  Vital signs are BP 130/58  Pulse 67  Temp(Src) 98 F (36.7 C) (Oral)  Ht 5\' 4"  (1.626 m)  Wt 147 lb (66.679 kg)  BMI 25.23 kg/m2  SpO2 100% HEENT:  No gross abnormalities Pulmonary: Respirations are non-labored Abdomen: Soft and non-tender  Musculoskeletal: There are no major deformities.   Neurologic: No focal weakness or paresthesias are detected, Skin: There are no ulcer or rashes noted. Psychiatric: The patient has normal affect. Cardiovascular: There is a regular rate and rhythm without significant murmur appreciated. Bilateral carotid bruits  Diagnostic Studies: I have reviewed her ultrasound which shows greater than 70% right carotid stenosis with a velocity profile of 452/188 on the right. Left-sided stenosis is in the 50-69% category    Assessment:  Asymptomatic right carotid stenosis Plan: I have discussed the risks and benefits of carotid endarterectomy. We discussed the risk of stroke, the risk of nerve injury, the risk of infection, the risk of cardiopulmonary complications. We also discussed the recovery period. The patient has previously been seen and evaluated by Dr. Hart Rochester and therefore I am going to have him see and evaluate the patient said that he can proceed with carotid endarterectomy.  Dr. Hart Rochester has seen and evaluated the patient and agrees with proceeding with right carotid endarterectomy. This has been scheduled for this Thursday, May 9. All the patient's questions were answered.    Jorge Ny, M.D. [facility] [place] Office: [phone] Pager:  [phone]

## 2012-02-10 NOTE — Op Note (Signed)
OPERATIVE REPORT  Date of Surgery: 02/10/2012  Surgeon: [name], MD  Assistant: Lianne Cure PA  Pre-op Diagnosis: Right internal carotid artery stenosis-severe-asymptomatic Post-op Diagnosis: Same Procedure: Procedure(s): Right carotid endarterectomy with Dacron patch angioplasty  Anesthesia: General  EBL: 300 cc Drains-one Jackson-Pratt Complications: None  Procedure Details: Patient was taken to the operating room placed in the supine position at which time satisfactory general endotracheal anesthesia was administered. The right neck was prepped with Betadine scrub and solution draped in a routine sterile manner. Incision was made along the anterior border of sternocleidomastoid muscle carried down through subcutaneous tissue and platysma using the Bovie. The common facial vein and external jugular veins ligated with 3-0 silk ties and divided. Common internal and external carotid arteries were dissected free and encircled with vessel loops. There was a relatively small common and internal carotid artery which was slightly thickened and the common carotid area. There was a focal atherosclerotic plaque at the carotid bifurcation extending about 2-3 cm but posteriorly extended above the hypoglossal nerve. The hypoglossal nerve and vagus nerves were carefully avoided. #10 shunt was prepared. The patient was heparinized. Carotid vessels were occluded with vascular clamps a longitudinal opening made in the common carotid with a 15 blade extended up the internal carotid with Potts scissors. It appeared that the disease had terminated at this point. A #10 shunt was inserted reestablishment of flow within 2 minutes. Following this a standard endarterectomy was then performed using the elevator and the Potts scissors. The eversion endarterectomy of the external carotid was performed. The plaque feathered off nicely but there was a posterior tail component to the plaque which extended quite far  distally. Therefore the shunt was removed for about 2-3 minutes and using the elevator the tail was completely removed distally although this was done in a somewhat blind fashion because of the high nature of the plaque. This came out quite nicely with excellent back bleeding from above. All loose 3 was carefully removed from around the site. The shunt was reinserted distally and there was about 4-5 minutes of no shunt time during this portion of the procedure. Following thorough irrigation with heparin saline arrival he was closed with a patch using continuous 6-0 Prolene. Prior to completion of closure shunt was removed after about 40 minutes shunt time following antegrade and retrograde flushing closure was completed reestablishment of flow initially of the external then up the internal branch. Carotid was occluded for less than 2 minutes for removal of the shunt. Both the internal and external carotid arteries were then evaluated with a Doppler probe. External had good flow initially the internal head very sluggish flow. I was concerned about this and therefore made a transverse opening in the patch after administering 2000 additional units of heparin. The 3 Fogarty catheter was passed up the distal internal carotid artery and would go about 8-10 cm before meeting some obstruction near the base of the skull. This was higher than where the shunt had been placed in much tired than where the plaque terminated. I explored this with a 3 Fogarty catheter as well as a 2-1/2 dilator and there was no defect in the area of the vessel where we had been performing surgery but was met this was much higher in the internal carotid. There was excellent pulsatile back bleeding coming from above. Therefore the graftotomy was reclosed with 6-0 Prolene clamps released and now there seemed to be much improved flow up the internal carotid artery which was brisk. Protamine  was given to reverse the heparin following adequate hemostasis  Jackson-Pratt drain brought out through an inferiorly based stab wound secured with silk suture. Wound was closed in layers with Vicryl in a subcuticular fashion. Sterile dressing applied patient taken to recovery in stable condition  [name], MD 02/10/2012 11:44 AM

## 2012-02-10 NOTE — Anesthesia Postprocedure Evaluation (Signed)
  Anesthesia Post-op Note  Patient: [name]  Procedure(s) Performed: Procedure(s) (LRB): ENDARTERECTOMY CAROTID (Right)  Patient Location: PACU  Anesthesia Type: General  Level of Consciousness: awake, alert  and oriented  Airway and Oxygen Therapy: Patient Spontanous Breathing  Post-op Pain: mild  Post-op Assessment: Post-op Vital signs reviewed  Post-op Vital Signs: Reviewed  Complications: No apparent anesthesia complications

## 2012-02-10 NOTE — Transfer of Care (Signed)
Immediate Anesthesia Transfer of Care Note  Patient: [name]  Procedure(s) Performed: Procedure(s) (LRB): ENDARTERECTOMY CAROTID (Right)  Patient Location: PACU  Anesthesia Type: General  Level of Consciousness: awake, alert , oriented and patient cooperative  Airway & Oxygen Therapy: Patient Spontanous Breathing and Patient connected to face mask oxygen  Post-op Assessment: Report given to PACU RN, Post -op Vital signs reviewed and stable, Patient moving all extremities, Patient moving all extremities X 4 and Patient able to stick tongue midline  Post vital signs: Reviewed and stable  Complications: No apparent anesthesia complications

## 2012-02-10 NOTE — Anesthesia Preprocedure Evaluation (Addendum)
Anesthesia Evaluation  Patient identified by MRN, date of birth, ID band Patient awake    Reviewed: Allergy & Precautions, H&P , NPO status , Patient's Chart, lab work & pertinent test results  Airway Mallampati: I TM Distance: >3 FB Neck ROM: Full    Dental  (+) Dental Advisory Given, Edentulous Upper and Edentulous Lower   Pulmonary  breath sounds clear to auscultation        Cardiovascular + CAD and + Past MI Rhythm:Regular Rate:Normal     Neuro/Psych    GI/Hepatic GERD-  ,  Endo/Other    Renal/GU      Musculoskeletal   Abdominal   Peds  Hematology   Anesthesia Other Findings   Reproductive/Obstetrics                          Anesthesia Physical Anesthesia Plan  ASA: III  Anesthesia Plan: General   Post-op Pain Management:    Induction: Intravenous  Airway Management Planned: Oral ETT  Additional Equipment: Arterial line  Intra-op Plan:   Post-operative Plan: Extubation in OR  Informed Consent: I have reviewed the patients History and Physical, chart, labs and discussed the procedure including the risks, benefits and alternatives for the proposed anesthesia with the patient or authorized representative who has indicated his/her understanding and acceptance.   Dental advisory given  Plan Discussed with: Anesthesiologist and Surgeon  Anesthesia Plan Comments:         Anesthesia Quick Evaluation

## 2012-02-11 ENCOUNTER — Encounter (HOSPITAL_COMMUNITY): Payer: Self-pay | Admitting: *Deleted

## 2012-02-11 LAB — CBC
HCT: 30.8 % — ABNORMAL LOW (ref 36.0–46.0)
Hemoglobin: 9.7 g/dL — ABNORMAL LOW (ref 12.0–15.0)
MCH: 26.1 pg (ref 26.0–34.0)
MCHC: 31.5 g/dL (ref 30.0–36.0)
MCV: 83 fL (ref 78.0–100.0)
Platelets: 220 10*3/uL (ref 150–400)
RDW: 15.6 % — ABNORMAL HIGH (ref 11.5–15.5)
WBC: 13 10*3/uL — ABNORMAL HIGH (ref 4.0–10.5)

## 2012-02-11 LAB — BASIC METABOLIC PANEL
Calcium: 8.6 mg/dL (ref 8.4–10.5)
Creatinine, Ser: 0.85 mg/dL (ref 0.50–1.10)
GFR calc Af Amer: 86 mL/min — ABNORMAL LOW (ref >90)
GFR calc non Af Amer: 75 mL/min — ABNORMAL LOW (ref >90)
Sodium: 137 mEq/L (ref 135–145)

## 2012-02-11 LAB — HEPARIN LEVEL (UNFRACTIONATED): Heparin Unfractionated: <0.1 IU/mL — ABNORMAL LOW (ref 0.30–0.70)

## 2012-02-11 LAB — APTT: aPTT: 40 seconds — ABNORMAL HIGH (ref 24–37)

## 2012-02-11 MED ORDER — OXYCODONE-ACETAMINOPHEN 5-325 MG PO TABS: 1.0000 | ORAL_TABLET | ORAL | Status: AC | PRN

## 2012-02-11 MED ORDER — ONDANSETRON 4 MG PO TBDP: 4.0000 mg | ORAL_TABLET | Freq: Two times a day (BID) | ORAL | Status: AC

## 2012-02-11 MED ORDER — ONDANSETRON 4 MG PO TBDP
4.0000 mg | ORAL_TABLET | Freq: Two times a day (BID) | ORAL | Status: DC
  Administered 2012-02-11 – 2012-02-12 (×2): 4 mg via ORAL
  Filled 2012-02-11 (×4): qty 1

## 2012-02-11 MED FILL — Ondansetron HCl Inj 4 MG/2ML (2 MG/ML): INTRAMUSCULAR | Qty: 2 | Status: AC

## 2012-02-11 NOTE — Progress Notes (Signed)
Utilization review completed.  

## 2012-02-11 NOTE — Progress Notes (Addendum)
VASCULAR AND VEIN SURGERY POST - OP CEA PROGRESS NOTE  Date of Surgery: 02/10/2012 Surgeon: Surgeon(s): Pryor Ochoa, MD 1 Day Post-Op right Carotid Endarterectomy .  HPI: [name] is a 56 y.o. female who is 1 Day Post-Op right Carotid Endarterectomy . Patient is doing well. Pre-operative symptoms are Improved Patient denies headache; Patient denies difficulty swallowing; denies weakness in upper or lower extremities; Pt. denies other symptoms of stroke or TIA. Chief complaint is nausea this am IMAGING: No results found.  Significant Diagnostic Studies: CBC Lab Results  Component Value Date   WBC 13.0* 02/11/2012   HGB 9.7* 02/11/2012   HCT 30.8* 02/11/2012   MCV 83.0 02/11/2012   PLT 220 02/11/2012    BMET    Component Value Date/Time   NA 137 02/11/2012 0400   K 4.6 02/11/2012 0400   CL 106 02/11/2012 0400   CO2 21 02/11/2012 0400   GLUCOSE 152* 02/11/2012 0400   BUN 12 02/11/2012 0400   CREATININE 0.85 02/11/2012 0400   CALCIUM 8.6 02/11/2012 0400   GFRNONAA 75* 02/11/2012 0400   GFRAA 86* 02/11/2012 0400    COAG Lab Results  Component Value Date   INR 0.93 02/09/2012   INR 1.23 08/05/2011   INR 1.13 08/03/2011   No results found for this basename: PTT      Intake/Output Summary (Last 24 hours) at 02/11/12 0742 Last data filed at 02/11/12 0559  Gross per 24 hour  Intake   3765 ml  Output    465 ml  Net   3300 ml    Physical Exam:  BP Readings from Last 3 Encounters:  02/11/12 108/47  02/11/12 108/47  02/09/12 119/60   Temp Readings from Last 3 Encounters:  02/11/12 98.8 F (37.1 C) Oral  02/11/12 98.8 F (37.1 C) Oral  02/09/12 97.6 F (36.4 C)    SpO2 Readings from Last 3 Encounters:  02/11/12 97%  02/11/12 97%  02/09/12 95%   Pulse Readings from Last 3 Encounters:  02/11/12 78  02/11/12 78  02/09/12 59    Pt is A&O x 3 Gait is normal Speech is normal right Neck Wound is healing well Patient with Negative tongue deviation and Negative  facial droop Pt has good and equal strength in all extremities  Assessment: [name] is a 56 y.o. female is S/P Right Carotid endarterectomy Pt is voiding, ambulating and taking po well   Plan:We will continue heparin until later today and advance her diet as she tolerates once the nausea subsides. Plan to d/c drain later today based on output. She will remain on 3300  Discharge to: Home likely in am if all is well Follow-up in 4 weeks   Agree with above Right neck incision without hematoma-JP drainage minimal Neuro exam normal The blood pressure well controlled  Plan advance diet as tolerated. We'll discontinue heparin later today and DC home in am  Clinton Gallant [facility] [phone] 02/11/2012 7:42 AM

## 2012-02-11 NOTE — Progress Notes (Signed)
Right neck JP drain d/c'd per MD order.  Patient tolerated well, will continue to monitor.

## 2012-02-11 NOTE — Progress Notes (Signed)
D/C heparin and JP drain.  [name] MAUREEN

## 2012-02-12 NOTE — Progress Notes (Signed)
Pt d/c home per MD order. Pt VSS, afebrile. Carotid incision site clean, dry, well approximated. Pt taught about s/s of infection and when to call MD. D/c instructions given with prescriptions. Pt and pt family member at bedside verbalized understanding of d/c instructions. Upon d/c, pt noted to have n/v x1. Pt noted to be stable and pt verbalized desire to d/c home. PA notified, no new orders received. Pt d/c with family member, educated if n/v continues to notify MD.

## 2012-02-12 NOTE — Progress Notes (Addendum)
VASCULAR AND VEIN SURGERY POST - OP CEA PROGRESS NOTE  Date of Surgery: 02/10/2012 Surgeon: Surgeon(s): Pryor Ochoa, MD 2 Days Post-Op right Carotid Endarterectomy .  HPI: [name] is a 56 y.o. female who is 2 Days Post-Op right Carotid Endarterectomy . Patient is doing well. Patient reports slight headache - last PM releaved with tylenol; Patient denies difficulty swallowing; denies weakness in upper or lower extremities; Pt. denies other symptoms of stroke or TIA.  IMAGING: No results found.  Significant Diagnostic Studies: CBC Lab Results  Component Value Date   WBC 13.0* 02/11/2012   HGB 9.7* 02/11/2012   HCT 30.8* 02/11/2012   MCV 83.0 02/11/2012   PLT 220 02/11/2012    BMET    Component Value Date/Time   NA 137 02/11/2012 0400   K 4.6 02/11/2012 0400   CL 106 02/11/2012 0400   CO2 21 02/11/2012 0400   GLUCOSE 152* 02/11/2012 0400   BUN 12 02/11/2012 0400   CREATININE 0.85 02/11/2012 0400   CALCIUM 8.6 02/11/2012 0400   GFRNONAA 75* 02/11/2012 0400   GFRAA 86* 02/11/2012 0400    COAG Lab Results  Component Value Date   INR 0.93 02/09/2012   INR 1.23 08/05/2011   INR 1.13 08/03/2011   No results found for this basename: PTT      Intake/Output Summary (Last 24 hours) at 02/12/12 0838 Last data filed at 02/11/12 2200  Gross per 24 hour  Intake    740 ml  Output   1350 ml  Net   -610 ml    Physical Exam:  BP Readings from Last 3 Encounters:  02/12/12 116/50  02/12/12 116/50  02/09/12 119/60   Temp Readings from Last 3 Encounters:  02/12/12 98.8 F (37.1 C) Oral  02/12/12 98.8 F (37.1 C) Oral  02/09/12 97.6 F (36.4 C)    SpO2 Readings from Last 3 Encounters:  02/12/12 96%  02/12/12 96%  02/09/12 95%   Pulse Readings from Last 3 Encounters:  02/12/12 86  02/12/12 86  02/09/12 59    Pt is A&O x 3 Gait is normal Speech is fluent right Neck Wound is healing well Patient with Negative tongue deviation and Negative facial droop Pt has good  and equal strength in all extremities  Assessment: [name] is a 56 y.o. female is S/P Right Carotid endarterectomy Pt is voiding, ambulating and taking po well   Plan: Discharge to: Home Follow-up in 2 weeks   Marlowe Shores [phone] 02/12/2012 8:38 AM   I have examined the patient, reviewed and agree with above.  Kenderick Kobler, MD 02/12/2012 9:01 AM

## 2012-02-12 NOTE — Discharge Summary (Signed)
[facility] Discharge Summary   Patient ID:  [name] MRN: [id number] DOB/AGE: [birth year] 56 y.o.  Admit date: 02/10/2012 Discharge date: 02/12/2012 Date of Surgery: 02/10/2012 Surgeon: Surgeon(s): Pryor Ochoa, MD  Admission Diagnosis: ICA STENOSIS RIGHT  Discharge Diagnoses:  ICA STENOSIS RIGHT  Secondary Diagnoses: Past Medical History  Diagnosis Date  . Aortic aneurysm   . Depression   . Anxiety   . Acid reflux   . Claudication   . DJD (degenerative joint disease) of lumbar spine   . Coronary artery disease   . Peripheral vascular disease   . Myocardial infarction 08/05/2011  . AAA (abdominal aortic aneurysm)   . Carotid artery narrowing     Procedure(s): ENDARTERECTOMY CAROTID  Discharged Condition: good  HPI:  [name] is a 56 y.o. female who was seen in our office for  new problem of right carotid stenosis. She has previously seen Dr. Hart Rochester for aortoiliac occlusive disease. She was scheduled for aortobifemoral bypass graft last summer however failed her cardiac evaluation and has just recently undergone coronary artery bypass grafting by Dr. Cornelius Moras. She had a carotid duplex on 01/20/2012 which revealed a high-grade right carotid stenosis, greater than 70%. Velocity profile was 452/188. There was 50-69% stenosis on the left. Currently, the patient denies symptoms. She denies slurred speech. She denies amaurosis fugax. She denies numbness or weakness in either extremity. She is admitted for right carotid endarterectomy   Hospital Course:  [name] is a 56 y.o. female is S/P Right Procedure(s): ENDARTERECTOMY CAROTID Extubated: POD # 0 Post-op wounds healing well Pt. Ambulating, voiding and taking PO diet without difficulty. Pt pain controlled with PO pain meds. Labs as below Complications:none  Consults:     Significant Diagnostic Studies: CBC Lab Results  Component Value Date   WBC 13.0* 02/11/2012   HGB 9.7* 02/11/2012     HCT 30.8* 02/11/2012   MCV 83.0 02/11/2012   PLT 220 02/11/2012    BMET    Component Value Date/Time   NA 137 02/11/2012 0400   K 4.6 02/11/2012 0400   CL 106 02/11/2012 0400   CO2 21 02/11/2012 0400   GLUCOSE 152* 02/11/2012 0400   BUN 12 02/11/2012 0400   CREATININE 0.85 02/11/2012 0400   CALCIUM 8.6 02/11/2012 0400   GFRNONAA 75* 02/11/2012 0400   GFRAA 86* 02/11/2012 0400   COAG Lab Results  Component Value Date   INR 0.93 02/09/2012   INR 1.23 08/05/2011   INR 1.13 08/03/2011     Disposition:  Discharge to :Home Discharge Orders    Future Orders Please Complete By Expires   Resume previous diet      Driving Restrictions      Comments:   No driving for 2 weeks   Lifting restrictions      Comments:   No lifting for 6 weeks   Call MD for:  temperature >100.5      Call MD for:  redness, tenderness, or signs of infection (pain, swelling, bleeding, redness, odor or green/yellow discharge around incision site)      Call MD for:  severe or increased pain, loss or decreased feeling  in affected limb(s)      Increase activity slowly      Comments:   Walk with assistance use walker or cane as needed   May shower       Scheduling Instructions:   48 hours with soap and water      Lowell Guitar,  Samuella Rasool  Home Medication Instructions [id number]   Printed on:02/12/12 0840  Medication Information                    omeprazole (PRILOSEC) 40 MG capsule Take 40 mg by mouth daily.             metoprolol tartrate (LOPRESSOR) 25 MG tablet Take 12.5 mg by mouth 2 (two) times daily.           rosuvastatin (CRESTOR) 40 MG tablet Take 40 mg by mouth daily.           acetaminophen (TYLENOL) 500 MG tablet Take 500 mg by mouth every 6 (six) hours as needed. For pain           aspirin EC 81 MG tablet Take 81 mg by mouth daily.           ondansetron (ZOFRAN-ODT) 4 MG disintegrating tablet Take 1 tablet (4 mg total) by mouth every 12 (twelve) hours.           oxyCODONE-acetaminophen  (PERCOCET) 5-325 MG per tablet Take 1-2 tablets by mouth every 4 (four) hours as needed.            Verbal and written Discharge instructions given to the patient. Wound care per Discharge AVS Follow-up Information    Follow up with [name], MD in 2 weeks. (sent to office)    Contact information:   [address] [place] [address] [phone]          Signed: Marlowe Shores 02/12/2012, 8:40 AM     I have examined the patient, reviewed and agree with above.  Dametria Tuzzolino, MD 02/12/2012 9:01 AM

## 2012-02-13 LAB — TYPE AND SCREEN
ABO/RH(D): A POS
Antibody Screen: NEGATIVE
Unit division: 0

## 2012-02-14 ENCOUNTER — Encounter (HOSPITAL_COMMUNITY): Payer: Self-pay | Admitting: Vascular Surgery

## 2012-02-22 ENCOUNTER — Encounter: Payer: Self-pay | Admitting: Vascular Surgery

## 2012-02-22 ENCOUNTER — Ambulatory Visit (INDEPENDENT_AMBULATORY_CARE_PROVIDER_SITE_OTHER): Payer: Medicaid Other | Admitting: Vascular Surgery

## 2012-02-22 VITALS — BP 120/80 | HR 77 | Resp 16 | Ht 64.0 in | Wt 143.0 lb

## 2012-02-22 DIAGNOSIS — I6529 Occlusion and stenosis of unspecified carotid artery: Secondary | ICD-10-CM

## 2012-02-22 NOTE — Progress Notes (Signed)
Subjective:     Patient ID: [name], female   DOB: Feb 17, 1956, 56 y.o.   MRN: [id number]  HPI this 56 year old female is 2 weeks post right carotid endarterectomy performed for severe asymptomatic right internal carotid stenosis. She tolerated the procedure well. She has no problems with hoarseness or any neurologic deficits. Occasionally her medications will "hangup" in her throat or swallowing is otherwise good. She denies any chest pain, dyspnea on exertion, PND, orthopnea.  Past Medical History  Diagnosis Date  . Aortic aneurysm   . Depression   . Anxiety   . Acid reflux   . Claudication   . DJD (degenerative joint disease) of lumbar spine   . Coronary artery disease   . Peripheral vascular disease   . Myocardial infarction 08/05/2011  . AAA (abdominal aortic aneurysm)   . Carotid artery narrowing     History  Substance Use Topics  . Smoking status: Former Smoker -- 0.5 packs/day for 30 years    Types: Cigarettes    Quit date: 08/05/2011  . Smokeless tobacco: Not on file  . Alcohol Use: No    Family History  Problem Relation Age of Onset  . Diabetes Mother   . Diabetes Sister   . Heart disease Father   . Heart attack Father   . Anesthesia problems Neg Hx     Allergies  Allergen Reactions  . Celebrex (Celecoxib) Swelling  . Eggs Or Egg-Derived Products Other (See Comments)    REACTION: tolerates flu vaccine, though    Current outpatient prescriptions:acetaminophen (TYLENOL) 500 MG tablet, Take 500 mg by mouth every 6 (six) hours as needed. For pain, Disp: , Rfl: ;  aspirin EC 81 MG tablet, Take 81 mg by mouth daily., Disp: , Rfl: ;  atorvastatin (LIPITOR) 40 MG tablet, Take 40 mg by mouth daily., Disp: , Rfl: ;  metoprolol succinate (TOPROL-XL) 25 MG 24 hr tablet, Take 25 mg by mouth daily. Take 1/2 tab daily, Disp: , Rfl:  omeprazole (PRILOSEC) 40 MG capsule, Take 20 mg by mouth daily. , Disp: , Rfl: ;  oxyCODONE-acetaminophen (PERCOCET) 5-325 MG per tablet,  Take 1-2 tablets by mouth every 4 (four) hours as needed., Disp: 30 tablet, Rfl: 0  BP 120/80  Pulse 77  Resp 16  Ht 5\' 4"  (1.626 m)  Wt 143 lb (64.864 kg)  BMI 24.55 kg/m2  Body mass index is 24.55 kg/(m^2).          Review of Systems     Objective:   Physical Exam blood pressure 120/80 heart rate 77 respirations 16 Right neck incision healing nicely. 3+ carotid pulse no audible bruit. Left carotid with 2-3+ pulse palpable. Neurologic exam normal. Lungs no rhonchi or wheezing    Assessment:     Doing well post right carotid endarterectomy for severe asymptomatic right internal carotid stenosis    Plan:     Return in 6 months for carotid duplex exam. If patient develops any neurologic symptoms in the interim she will be in touch with [place]. Continue daily aspirin

## 2012-02-23 NOTE — Progress Notes (Signed)
Addended by: Sharee Pimple on: 02/23/2012 09:30 AM   Modules accepted: Orders

## 2012-03-08 ENCOUNTER — Telehealth: Payer: Self-pay | Admitting: *Deleted

## 2012-03-08 NOTE — Telephone Encounter (Signed)
Patient called requesting oxycodone stating she had a "sore throat". She is  S/P right CEA 02/10/12. Patient denied and redness,swelling, drainage or difficulty swallowing. Explained to patient that warm salt water rinses would be soothing to the irritation and to use OTC ibuprofen providing she was not on a blood thinner.Patient then asked for tramadol .I offered to schedule her an office visit appointment if symptoms worsened. Also alerted patient if she developed signs of difficulty swallowing or breathing she needed to proceed to nearest ED. Patient understood and said she would try this for a couple of days and call if not improved.

## 2012-08-21 ENCOUNTER — Encounter: Payer: Self-pay | Admitting: Neurosurgery

## 2012-08-22 ENCOUNTER — Ambulatory Visit: Payer: Medicaid Other | Admitting: Neurosurgery

## 2012-08-22 ENCOUNTER — Other Ambulatory Visit: Payer: Medicaid Other

## 2012-08-28 ENCOUNTER — Encounter: Payer: Self-pay | Admitting: Neurosurgery

## 2012-08-29 ENCOUNTER — Ambulatory Visit (INDEPENDENT_AMBULATORY_CARE_PROVIDER_SITE_OTHER): Payer: Medicaid Other | Admitting: Neurosurgery

## 2012-08-29 ENCOUNTER — Encounter: Payer: Self-pay | Admitting: Neurosurgery

## 2012-08-29 VITALS — BP 156/79 | HR 63 | Resp 16 | Ht 64.0 in | Wt 154.2 lb

## 2012-08-29 DIAGNOSIS — Z48812 Encounter for surgical aftercare following surgery on the circulatory system: Secondary | ICD-10-CM

## 2012-08-29 DIAGNOSIS — I6529 Occlusion and stenosis of unspecified carotid artery: Secondary | ICD-10-CM

## 2012-08-29 NOTE — Progress Notes (Signed)
[facility] [place] Carotid Office Note  CC: Carotid surveillance Referring Physician: Hart Rochester  History of Present Illness: 56 year old female patient of Dr. Hart Rochester who is status post right CEA in may of 2013. The patient follows up for her six-month postoperative followup appointment however her duplex was denied by her insurance. The patient denies any signs or symptoms of CVA, TIA, amaurosis fugax or any neural deficit. The patient denies any new medical diagnoses or recent surgery.  Past Medical History  Diagnosis Date  . Aortic aneurysm   . Depression   . Anxiety   . Acid reflux   . Claudication   . DJD (degenerative joint disease) of lumbar spine   . Coronary artery disease   . Peripheral vascular disease   . Myocardial infarction 08/05/2011  . AAA (abdominal aortic aneurysm)   . Carotid artery narrowing     ROS: [x]  Positive   [ ]  Denies    General: [ ]  Weight loss, [ ]  Fever, [ ]  chills Neurologic: [ ]  Dizziness, [ ]  Blackouts, [ ]  Seizure [ ]  Stroke, [ ]  "Mini stroke", [ ]  Slurred speech, [ ]  Temporary blindness; [ ]  weakness in arms or legs, [ ]  Hoarseness Cardiac: [ ]  Chest pain/pressure, [ ]  Shortness of breath at rest [ ]  Shortness of breath with exertion, [ ]  Atrial fibrillation or irregular heartbeat Vascular: [ ]  Pain in legs with walking, [ ]  Pain in legs at rest, [ ]  Pain in legs at night,  [ ]  Non-healing ulcer, [ ]  Blood clot in vein/DVT,   Pulmonary: [ ]  Home oxygen, [ ]  Productive cough, [ ]  Coughing up blood, [ ]  Asthma,  [ ]  Wheezing Musculoskeletal:  [ ]  Arthritis, [ ]  Low back pain, [ ]  Joint pain Hematologic: [ ]  Easy Bruising, [ ]  Anemia; [ ]  Hepatitis Gastrointestinal: [ ]  Blood in stool, [ ]  Gastroesophageal Reflux/heartburn, [ ]  Trouble swallowing Urinary: [ ]  chronic Kidney disease, [ ]  on HD - [ ]  MWF or [ ]  TTHS, [ ]  Burning with urination, [ ]  Difficulty urinating Skin: [ ]  Rashes, [ ]  Wounds Psychological: [ ]  Anxiety, [ ]   Depression   Social History History  Substance Use Topics  . Smoking status: Former Smoker -- 0.5 packs/day for 30 years    Types: Cigarettes    Quit date: 08/05/2011  . Smokeless tobacco: Not on file  . Alcohol Use: No    Family History Family History  Problem Relation Age of Onset  . Diabetes Mother   . Diabetes Sister   . Heart disease Father   . Heart attack Father   . Anesthesia problems Neg Hx     Allergies  Allergen Reactions  . Celebrex (Celecoxib) Swelling  . Eggs Or Egg-Derived Products Other (See Comments)    REACTION: tolerates flu vaccine, though    Current Outpatient Prescriptions  Medication Sig Dispense Refill  . aspirin EC 81 MG tablet Take 81 mg by mouth daily.      Marland Kitchen atorvastatin (LIPITOR) 40 MG tablet Take 40 mg by mouth daily.      . metoprolol succinate (TOPROL-XL) 25 MG 24 hr tablet Take 25 mg by mouth daily. Take 1/2 tab daily      . omeprazole (PRILOSEC) 40 MG capsule Take 20 mg by mouth daily.       Marland Kitchen acetaminophen (TYLENOL) 500 MG tablet Take 500 mg by mouth every 6 (six) hours as needed. For pain  Physical Examination  Filed Vitals:   08/29/12 1345  BP: 156/79  Pulse: 63  Resp:     Body mass index is 26.47 kg/(m^2).  General:  WDWN in NAD Gait: Normal HEENT: WNL Eyes: Pupils equal Pulmonary: normal non-labored breathing , without Rales, rhonchi,  wheezing Cardiac: RRR, without  Murmurs, rubs or gallops; Abdomen: soft, NT, no masses Skin: no rashes, ulcers noted  Vascular Exam Pulses: 2+ radial pulses bilaterally Carotid bruits: Carotid pulse on the operative side right side, there is a pronounced bruit heard on the left that Dr. Hart Rochester did not here in his first postoperative appointment in May 2013 Extremities without ischemic changes, no Gangrene , no cellulitis; no open wounds;  Musculoskeletal: no muscle wasting or atrophy   Neurologic: A&O X 3; Appropriate Affect ; SENSATION: normal; MOTOR FUNCTION:  moving all  extremities equally. Speech is fluent/normal  Non-Invasive Vascular Imaging CAROTID DUPLEX 08/29/2012  Carotid duplex denied per her insurance  ASSESSMENT/PLAN: Asymptomatic patient with a new pronounced left-sided carotid bruit. We will set the patient up to come back in the next 2-3 weeks hopefully with approved carotid duplex study. The patient is in agreement with this. The patient's questions were encouraged and answered.  Lauree Chandler ANP   Clinic MD:  Hart Rochester

## 2012-08-29 NOTE — Addendum Note (Signed)
Addended by: Dannielle Karvonen on: 08/29/2012 04:13 PM   Modules accepted: Orders

## 2012-09-11 ENCOUNTER — Encounter: Payer: Self-pay | Admitting: Neurosurgery

## 2012-09-12 ENCOUNTER — Encounter: Payer: Self-pay | Admitting: Neurosurgery

## 2012-09-12 ENCOUNTER — Other Ambulatory Visit (INDEPENDENT_AMBULATORY_CARE_PROVIDER_SITE_OTHER): Payer: Medicaid Other | Admitting: *Deleted

## 2012-09-12 ENCOUNTER — Ambulatory Visit (INDEPENDENT_AMBULATORY_CARE_PROVIDER_SITE_OTHER): Payer: Medicaid Other | Admitting: Neurosurgery

## 2012-09-12 VITALS — BP 118/66 | HR 65 | Resp 16 | Ht 64.0 in | Wt 155.0 lb

## 2012-09-12 DIAGNOSIS — Z48812 Encounter for surgical aftercare following surgery on the circulatory system: Secondary | ICD-10-CM

## 2012-09-12 DIAGNOSIS — R0989 Other specified symptoms and signs involving the circulatory and respiratory systems: Secondary | ICD-10-CM

## 2012-09-12 DIAGNOSIS — I6529 Occlusion and stenosis of unspecified carotid artery: Secondary | ICD-10-CM

## 2012-09-12 NOTE — Progress Notes (Signed)
[facility] [name] Office Note  CC: Alvarado surveillance Referring Physician: Hart Rochester  History of Present Illness: 56 year old female patient of Dr. Hart Rochester who status post right CEA Feb 10 2012. The patient denies any signs or symptoms of CVA, TIA, amaurosis fugax or any neural deficit. The patient denies any new medical diagnoses or recent surgery.  Past Medical History  Diagnosis Date  . Aortic aneurysm   . Depression   . Anxiety   . Acid reflux   . Claudication   . DJD (degenerative joint disease) of lumbar spine   . Coronary artery disease   . Peripheral vascular disease   . Myocardial infarction 08/05/2011  . AAA (abdominal aortic aneurysm)   . Al[place] artery narrowing     ROS: [x]  Positive   [ ]  Denies    General: [ ]  Weight loss, [ ]  Fever, [ ]  chills Neurologic: [ ]  Dizziness, [ ]  Blackouts, [ ]  Seizure [ ]  Stroke, [ ]  "Mini stroke", [ ]  Slurred speech, [ ]  Temporary blindness; [ ]  weakness in arms or legs, [ ]  Hoarseness Cardiac: [ ]  Chest pain/pressure, [ ]  Shortness of breath at rest [ ]  Shortness of breath with exertion, [ ]  Atrial fibrillation or irregular heartbeat Vascular: [ ]  Pain in legs with walking, [ ]  Pain in legs at rest, [ ]  Pain in legs at night,  [ ]  Non-healing ulcer, [ ]  Blood clot in vein/DVT,   Pulmonary: [ ]  Home oxygen, [ ]  Productive cough, [ ]  Coughing up blood, [ ]  Asthma,  [ ]  Wheezing Musculoskeletal:  [ ]  Arthritis, [ ]  Low back pain, [ ]  Joint pain Hematologic: [ ]  Easy Bruising, [ ]  Anemia; [ ]  Hepatitis Gastrointestinal: [ ]  Blood in stool, [ ]  Gastroesophageal Reflux/heartburn, [ ]  Trouble swallowing Urinary: [ ]  chronic Kidney disease, [ ]  on HD - [ ]  MWF or [ ]  TTHS, [ ]  Burning with urination, [ ]  Difficulty urinating Skin: [ ]  Rashes, [ ]  Wounds Psychological: [ ]  Anxiety, [ ]  Depression   Social History History  Substance Use Topics  . Smoking status: Former Smoker -- 0.5 packs/day for 30 years   Types: Cigarettes    Quit date: 08/05/2011  . Smokeless tobacco: Not on file  . Alcohol Use: No    Family History Family History  Problem Relation Age of Onset  . Diabetes Mother   . Diabetes Sister   . Heart disease Father   . Heart attack Father   . Anesthesia problems Neg Hx     Allergies  Allergen Reactions  . Celebrex (Celecoxib) Swelling  . Eggs Or Egg-Derived Products Other (See Comments)    REACTION: tolerates flu vaccine, though    Current Outpatient Prescriptions  Medication Sig Dispense Refill  . aspirin EC 81 MG tablet Take 81 mg by mouth daily.      Marland Kitchen atorvastatin (LIPITOR) 40 MG tablet Take 40 mg by mouth daily.      . metoprolol succinate (TOPROL-XL) 25 MG 24 hr tablet Take 25 mg by mouth daily. Take 1/2 tab daily      . omeprazole (PRILOSEC) 40 MG capsule Take 20 mg by mouth daily.       Marland Kitchen acetaminophen (TYLENOL) 500 MG tablet Take 500 mg by mouth every 6 (six) hours as needed. For pain        Physical Examination  Filed Vitals:   09/12/12 1151  BP: 118/66  Pulse: 65  Resp: 16  Body mass index is 26.61 kg/(m^2).  General:  WDWN in NAD Gait: Normal HEENT: WNL Eyes: Pupils equal Pulmonary: normal non-labored breathing , without Rales, rhonchi,  wheezing Cardiac: RRR, without  Murmurs, rubs or gallops; Abdomen: soft, NT, no masses Skin: no rashes, ulcers noted  Vascular Exam Pulses: 3+ radial pulses bilaterally Alvarado bruits: Alvarado pulses to auscultation no bruits are heard Extremities without ischemic changes, no Gangrene , no cellulitis; no open wounds;  Musculoskeletal: no muscle wasting or atrophy   Neurologic: A&O X 3; Appropriate Affect ; SENSATION: normal; MOTOR FUNCTION:  moving all extremities equally. Speech is fluent/normal  Non-Invasive Vascular Imaging Alvarado DUPLEX 09/12/2012  Right ICA 0 - 19% stenosis with evidence of hyperplasia noted at the proximal patch site, velocities are mildly increased at this segment at 114 cm  a second Left ICA 40 - 59 % stenosis   ASSESSMENT/PLAN: Asymptomatic patient 7 months status post right CEA doing well. The patient will followup in 6 months for repeat Alvarado duplex. The patient's questions were encouraged and answered, she is in agreement with this plan.   Lauree Chandler ANP   Clinic MD: Hart Rochester

## 2012-09-13 NOTE — Addendum Note (Signed)
Addended by: Lorin Mercy K on: 09/13/2012 11:10 AM   Modules accepted: Orders

## 2012-10-18 ENCOUNTER — Emergency Department (HOSPITAL_COMMUNITY)
Admission: EM | Admit: 2012-10-18 | Discharge: 2012-10-18 | Disposition: A | Discharge status: Home / Self Care | Payer: Medicaid Other | Attending: Emergency Medicine | Admitting: Emergency Medicine

## 2012-10-18 ENCOUNTER — Encounter (HOSPITAL_COMMUNITY): Payer: Self-pay | Admitting: Emergency Medicine

## 2012-10-18 ENCOUNTER — Emergency Department (HOSPITAL_COMMUNITY): Payer: Medicaid Other

## 2012-10-18 DIAGNOSIS — R0789 Other chest pain: Secondary | ICD-10-CM | POA: Insufficient documentation

## 2012-10-18 DIAGNOSIS — F411 Generalized anxiety disorder: Secondary | ICD-10-CM | POA: Insufficient documentation

## 2012-10-18 DIAGNOSIS — Z7982 Long term (current) use of aspirin: Secondary | ICD-10-CM | POA: Insufficient documentation

## 2012-10-18 DIAGNOSIS — R059 Cough, unspecified: Secondary | ICD-10-CM | POA: Insufficient documentation

## 2012-10-18 DIAGNOSIS — I2510 Atherosclerotic heart disease of native coronary artery without angina pectoris: Secondary | ICD-10-CM | POA: Insufficient documentation

## 2012-10-18 DIAGNOSIS — R05 Cough: Secondary | ICD-10-CM | POA: Insufficient documentation

## 2012-10-18 DIAGNOSIS — I739 Peripheral vascular disease, unspecified: Secondary | ICD-10-CM | POA: Insufficient documentation

## 2012-10-18 DIAGNOSIS — Z87891 Personal history of nicotine dependence: Secondary | ICD-10-CM | POA: Insufficient documentation

## 2012-10-18 DIAGNOSIS — F3289 Other specified depressive episodes: Secondary | ICD-10-CM | POA: Insufficient documentation

## 2012-10-18 DIAGNOSIS — R6883 Chills (without fever): Secondary | ICD-10-CM | POA: Insufficient documentation

## 2012-10-18 DIAGNOSIS — M51379 Other intervertebral disc degeneration, lumbosacral region without mention of lumbar back pain or lower extremity pain: Secondary | ICD-10-CM | POA: Insufficient documentation

## 2012-10-18 DIAGNOSIS — IMO0001 Reserved for inherently not codable concepts without codable children: Secondary | ICD-10-CM | POA: Insufficient documentation

## 2012-10-18 DIAGNOSIS — K219 Gastro-esophageal reflux disease without esophagitis: Secondary | ICD-10-CM | POA: Insufficient documentation

## 2012-10-18 DIAGNOSIS — M5137 Other intervertebral disc degeneration, lumbosacral region: Secondary | ICD-10-CM | POA: Insufficient documentation

## 2012-10-18 DIAGNOSIS — F329 Major depressive disorder, single episode, unspecified: Secondary | ICD-10-CM | POA: Insufficient documentation

## 2012-10-18 DIAGNOSIS — Z9889 Other specified postprocedural states: Secondary | ICD-10-CM | POA: Insufficient documentation

## 2012-10-18 DIAGNOSIS — R0602 Shortness of breath: Secondary | ICD-10-CM | POA: Insufficient documentation

## 2012-10-18 DIAGNOSIS — R6889 Other general symptoms and signs: Secondary | ICD-10-CM

## 2012-10-18 DIAGNOSIS — Z8679 Personal history of other diseases of the circulatory system: Secondary | ICD-10-CM | POA: Insufficient documentation

## 2012-10-18 DIAGNOSIS — I252 Old myocardial infarction: Secondary | ICD-10-CM | POA: Insufficient documentation

## 2012-10-18 DIAGNOSIS — Z951 Presence of aortocoronary bypass graft: Secondary | ICD-10-CM | POA: Insufficient documentation

## 2012-10-18 LAB — URINALYSIS, ROUTINE W REFLEX MICROSCOPIC
Glucose, UA: NEGATIVE mg/dL
Ketones, ur: NEGATIVE mg/dL
Nitrite: NEGATIVE
Protein, ur: NEGATIVE mg/dL

## 2012-10-18 LAB — URINE MICROSCOPIC-ADD ON

## 2012-10-18 MED ORDER — ALBUTEROL SULFATE HFA 108 (90 BASE) MCG/ACT IN AERS
2.0000 | INHALATION_SPRAY | Freq: Once | RESPIRATORY_TRACT | Status: AC
  Administered 2012-10-18: 2 via RESPIRATORY_TRACT
  Filled 2012-10-18: qty 6.7

## 2012-10-18 NOTE — ED Provider Notes (Signed)
History     CSN: [id number]  Arrival date & time 10/18/12  1753   First MD Initiated Contact with Patient 10/18/12 2052      Chief Complaint  Patient presents with  . Flank Pain  . Influenza     Patient is a 57 y.o. female presenting with cough. The history is provided by the patient.  Cough This is a new problem. The current episode started more than 2 days ago. The problem occurs hourly. The problem has not changed since onset.The cough is productive of sputum. Associated symptoms include chest pain, chills, myalgias and shortness of breath. Treatments tried: rest. She is a smoker.  pt reports cough/congestion that started last weekend.  She reports she has cough with whitish sputum, nasal congestion, myalgias and she had fever last weekend.  She reports her fever "has broke" but still has cough/congestion.  Due tot he coughing she has chest tightness. She reports she feels mildly SOB with walking since her cough has started.    She also reports bilateral flank pain for weeks.  No dysuria.  No lower abdominal pain.    Past Medical History  Diagnosis Date  . Aortic aneurysm   . Depression   . Anxiety   . Acid reflux   . Claudication   . DJD (degenerative joint disease) of lumbar spine   . Coronary artery disease   . Peripheral vascular disease   . Myocardial infarction 08/05/2011  . AAA (abdominal aortic aneurysm)   . Carotid artery narrowing     Past Surgical History  Procedure Date  . Cholecystectomy   . Abdominal hysterectomy   . Spine surgery 2000 & 2002    lumbar laminectiomies  . Aortogram with runoff 04/27/2011    Bilateral runoff by Dr. Myra Gianotti  . Coronary artery bypass graft 08/05/2011    CABG x3 using LIMA to D1, SVG to OM, SVG to PDA, open vein harvest bilateral thighs  . Back surgery     2000, 2002  . Endarterectomy 02/10/2012    Procedure: ENDARTERECTOMY CAROTID;  Surgeon: Pryor Ochoa, MD;  Location: [facility] OR;  Service: Vascular;  Laterality: Right;  .  Carotid endarterectomy 02/10/2012    Right  cea    Family History  Problem Relation Age of Onset  . Diabetes Mother   . Diabetes Sister   . Heart disease Father   . Heart attack Father   . Anesthesia problems Neg Hx     History  Substance Use Topics  . Smoking status: Former Smoker -- 0.5 packs/day for 30 years    Types: Cigarettes    Quit date: 08/05/2011  . Smokeless tobacco: Not on file  . Alcohol Use: No    OB History    Grav Para Term Preterm Abortions TAB SAB Ect Mult Living                  Review of Systems  Constitutional: Positive for chills.  Respiratory: Positive for cough and shortness of breath.   Cardiovascular: Positive for chest pain.  Gastrointestinal: Negative for vomiting.  Musculoskeletal: Positive for myalgias.  Neurological: Negative for weakness.  Psychiatric/Behavioral: Negative for agitation.  All other systems reviewed and are negative.    Allergies  Celebrex and Eggs or egg-derived products  Home Medications   Current Outpatient Rx  Name  Route  Sig  Dispense  Refill  . ASPIRIN EC 81 MG PO TBEC   Oral   Take 81 mg by mouth daily.         Marland Kitchen  ATORVASTATIN CALCIUM 40 MG PO TABS   Oral   Take 40 mg by mouth daily.         Marland Kitchen METOPROLOL SUCCINATE ER 25 MG PO TB24   Oral   Take 12.5 mg by mouth daily.          Marland Kitchen OMEPRAZOLE 20 MG PO CPDR   Oral   Take 20 mg by mouth daily.           BP 129/49  Pulse 80  Temp 97.9 F (36.6 C) (Oral)  Resp 20  SpO2 97%  Physical Exam CONSTITUTIONAL: Well developed/well nourished HEAD AND FACE: Normocephalic/atraumatic EYES: EOMI/PERRL ENMT: Mucous membranes moist, nasal congestion NECK: supple no meningeal signs SPINE:entire spine nontender CV: S1/S2 noted, no murmurs/rubs/gallops noted LUNGS: brief scattered wheeze.  No distress.  Pt is resting comfortably and speaks to me comfortably ABDOMEN: soft, nontender, no rebound or guarding GU:no cva tenderness NEURO: Pt is  awake/alert, moves all extremitiesx4 EXTREMITIES: pulses normal, full ROM, no edema noted SKIN: warm, color normal PSYCH: no abnormalities of mood noted  ED Course  Procedures   Labs Reviewed  URINALYSIS, ROUTINE W REFLEX MICROSCOPIC - Abnormal; Notable for the following:    APPearance CLOUDY (*)     Leukocytes, UA LARGE (*)     All other components within normal limits  URINE MICROSCOPIC-ADD ON - Abnormal; Notable for the following:    Squamous Epithelial / LPF MANY (*)     Bacteria, UA MANY (*)     All other components within normal limits  URINE CULTURE   Dg Chest 2 View  10/18/2012  *RADIOLOGY REPORT*  Clinical Data: Chest pain, congestion, and cough for 1 week.  CHEST - 2 VIEW  Comparison: 08/30/2011  Findings: Postoperative changes in the cervical spine and mediastinum are stable. The heart size and pulmonary vascularity are normal. The lungs appear clear and expanded without focal air space disease or consolidation. No blunting of the costophrenic angles.  No pneumothorax.  Mediastinal contours appear intact. Surgical clips in the right upper quadrant.  IMPRESSION: No evidence of active pulmonary disease.   Original Report Authenticated By: Burman Nieves, M.D.      1. Flu-like symptoms     Pt with cough/congestion and had chest tightness after cough.  Doubt ACS/PE.  She had scattered wheezes and reports she feels improved after albuterol.  Her CXR is clear.  She is no distress, no tachypnea and no hypoxia.   Suspect viral syndrome.  Her abdominal/flank exam was negative.  Urine shows likely contaminant will send for culture  MDM  Nursing notes including past medical history and social history reviewed and considered in documentation xrays reviewed and considered Labs/vital reviewed and considered        Date: 10/18/2012  Rate: 88  Rhythm: normal sinus rhythm  QRS Axis: normal  Intervals: normal  ST/T Wave abnormalities: nonspecific ST changes  Conduction  Disutrbances:none  Narrative Interpretation:   Old EKG Reviewed: unchanged    Joya Gaskins, MD 10/18/12 2249

## 2012-10-18 NOTE — ED Notes (Signed)
Pt c/o flank pain x 3 weeks and fever x x 2 days with body aches

## 2012-10-20 LAB — URINE CULTURE

## 2012-10-21 NOTE — ED Notes (Signed)
+   Urine Chart sent to EDP office for review. 

## 2012-10-25 NOTE — ED Notes (Signed)
Chart sent to EDP office for review. Rx for Keflex 500 mg 1 QID x 5 days written by Vedia Pereyra.

## 2012-10-26 NOTE — ED Notes (Signed)
Prescription called in to [facility] at [id number] for keflex 500mg  qid x5days, no refills, per Langley Adie, pa-c.

## 2013-03-07 ENCOUNTER — Other Ambulatory Visit: Payer: Self-pay | Admitting: Cardiovascular Disease

## 2013-03-13 ENCOUNTER — Ambulatory Visit: Payer: Medicaid Other | Admitting: Neurosurgery

## 2013-03-13 ENCOUNTER — Other Ambulatory Visit: Payer: Medicaid Other

## 2013-03-20 ENCOUNTER — Other Ambulatory Visit: Payer: Self-pay | Admitting: Neurosurgery

## 2013-03-20 DIAGNOSIS — M546 Pain in thoracic spine: Secondary | ICD-10-CM

## 2013-03-25 ENCOUNTER — Other Ambulatory Visit: Payer: Medicaid Other

## 2013-03-31 ENCOUNTER — Inpatient Hospital Stay: Admission: RE | Admit: 2013-03-31 | Payer: Medicaid Other | Source: Ambulatory Visit

## 2013-04-02 ENCOUNTER — Encounter: Payer: Self-pay | Admitting: Vascular Surgery

## 2013-04-03 ENCOUNTER — Encounter: Payer: Self-pay | Admitting: Vascular Surgery

## 2013-04-03 ENCOUNTER — Ambulatory Visit (INDEPENDENT_AMBULATORY_CARE_PROVIDER_SITE_OTHER): Payer: Medicaid Other | Admitting: Vascular Surgery

## 2013-04-03 ENCOUNTER — Other Ambulatory Visit (INDEPENDENT_AMBULATORY_CARE_PROVIDER_SITE_OTHER): Payer: Medicaid Other | Admitting: *Deleted

## 2013-04-03 DIAGNOSIS — I6529 Occlusion and stenosis of unspecified carotid artery: Secondary | ICD-10-CM

## 2013-04-03 DIAGNOSIS — Z48812 Encounter for surgical aftercare following surgery on the circulatory system: Secondary | ICD-10-CM

## 2013-04-03 NOTE — Progress Notes (Signed)
Subjective:     Patient ID: [name], female   DOB: 1955/12/03, 57 y.o.   MRN: [id number]  HPI this 57 year old female is one year post right carotid endarterectomy for severe asymptomatic right carotid stenosis. She denies any neurologic symptoms including lateralizing weakness, aphasia, amaurosis fugax, or syncope. She does have weakness in both calves with ambulation-able to walk about one half block. She denies rest pain or nonhealing ulcers or infection or gangrene of the feet.  Past Medical History  Diagnosis Date  . Aortic aneurysm   . Depression   . Anxiety   . Acid reflux   . Claudication   . DJD (degenerative joint disease) of lumbar spine   . Coronary artery disease   . Peripheral vascular disease   . Myocardial infarction 08/05/2011  . AAA (abdominal aortic aneurysm)   . Carotid artery narrowing     History  Substance Use Topics  . Smoking status: Former Smoker -- 0.50 packs/day for 30 years    Types: Cigarettes    Quit date: 08/05/2011  . Smokeless tobacco: Never Used  . Alcohol Use: No    Family History  Problem Relation Age of Onset  . Diabetes Mother   . Diabetes Sister   . Heart disease Father   . Heart attack Father   . Anesthesia problems Neg Hx     Allergies  Allergen Reactions  . Celebrex (Celecoxib) Swelling  . Eggs Or Egg-Derived Products Other (See Comments)    REACTION: tolerates flu vaccine, though    Current outpatient prescriptions:aspirin EC 81 MG tablet, Take 81 mg by mouth daily., Disp: , Rfl: ;  atorvastatin (LIPITOR) 40 MG tablet, Take 40 mg by mouth daily., Disp: , Rfl: ;  metoprolol succinate (TOPROL-XL) 25 MG 24 hr tablet, Take 12.5 mg by mouth daily. , Disp: , Rfl: ;  omeprazole (PRILOSEC) 20 MG capsule, TAKE ONE CAPSULE EVERY DAY, Disp: 30 capsule, Rfl: 3 zolpidem (AMBIEN) 10 MG tablet, Take 5 mg by mouth at bedtime as needed for sleep. , Disp: , Rfl:   BP 122/77  Pulse 61  Resp 16  Ht 5\' 4"  (1.626 m)  Wt 162 lb (73.483 kg)   BMI 27.79 kg/m2  SpO2 96%  Body mass index is 27.79 kg/(m^2).           Review of Systems complains of leg pain with walking. Swelling in legs care. Weakness in the legs. Denies chest pain, dyspnea on exertion, PND, orthopnea, hemoptysis. States that she does not void as much as she used to.-Other systems negative and a complete review of systems     Objective:   Physical Exam blood pressure 122/77 heart rate 61 respirations 16 Gen.-alert and oriented x3 in no apparent distress HEENT normal for age Lungs no rhonchi or wheezing Cardiovascular regular rhythm no murmurs carotid pulses 3+ palpable no bruits audible Abdomen soft nontender no palpable masses Musculoskeletal free of  major deformities Skin clear -no rashes Neurologic normal Lower extremities 3+ femoral pulses bilaterally. No popliteal or distal pulses palpable. Both feet well perfused with no evidence of infection gangrene or cellulitis.  I ordered a carotid duplex exam which I reviewed and interpreted. There is no significant flow reduction at the right carotid endarterectomy site but some mild intimal thickening is noted. The left common and internal carotid artery has moderate stenosis in the 60-70% range. There is also a stenosis at the origin of the right subclavian artery with some abnormal flow in the right vertebral  artery       Assessment:     Doing well status post right carotid endarterectomy 13 months ago-no evidence of significant restenosis Mild right subclavian stenosis asymptomatic Mild to moderate left carotid stenosis-asymptomatic    Plan:     Return in 12 months for followup carotid duplex exam to monitor carotid endarterectomy site on the right and contralateral disease Continue daily aspirin

## 2013-04-03 NOTE — Addendum Note (Signed)
Addended by: Adria Dill L on: 04/03/2013 03:01 PM   Modules accepted: Orders

## 2013-04-05 ENCOUNTER — Other Ambulatory Visit: Payer: Medicaid Other

## 2013-04-12 ENCOUNTER — Ambulatory Visit
Admission: RE | Admit: 2013-04-12 | Discharge: 2013-04-12 | Disposition: A | Discharge status: Home / Self Care | Payer: Medicaid Other | Source: Ambulatory Visit | Attending: Neurosurgery | Admitting: Neurosurgery

## 2013-04-12 DIAGNOSIS — M546 Pain in thoracic spine: Secondary | ICD-10-CM

## 2013-07-08 ENCOUNTER — Other Ambulatory Visit: Payer: Self-pay | Admitting: Cardiovascular Disease

## 2013-07-08 ENCOUNTER — Other Ambulatory Visit: Payer: Self-pay | Admitting: Cardiology

## 2013-07-10 NOTE — Telephone Encounter (Signed)
Rx was sent to pharmacy electronically. 

## 2013-07-30 ENCOUNTER — Other Ambulatory Visit: Payer: Self-pay | Admitting: Cardiovascular Disease

## 2013-07-30 NOTE — Telephone Encounter (Signed)
Patient needs to make an appointment to receive future refills and has had multiple warnings. Rx request denied, sent to pharmacy electronically.

## 2013-08-09 ENCOUNTER — Ambulatory Visit: Payer: Medicaid Other | Admitting: Cardiovascular Disease

## 2013-11-19 ENCOUNTER — Other Ambulatory Visit: Payer: Self-pay | Admitting: Vascular Surgery

## 2013-11-19 DIAGNOSIS — I6529 Occlusion and stenosis of unspecified carotid artery: Secondary | ICD-10-CM

## 2013-11-19 DIAGNOSIS — Z48812 Encounter for surgical aftercare following surgery on the circulatory system: Secondary | ICD-10-CM

## 2014-04-08 ENCOUNTER — Encounter: Payer: Self-pay | Admitting: Vascular Surgery

## 2014-04-09 ENCOUNTER — Other Ambulatory Visit (HOSPITAL_COMMUNITY): Payer: Medicaid Other

## 2014-04-09 ENCOUNTER — Ambulatory Visit: Payer: Medicaid Other | Admitting: Vascular Surgery

## 2016-08-19 ENCOUNTER — Ambulatory Visit: Payer: Medicaid Other | Admitting: Neurology

## 2016-10-19 ENCOUNTER — Ambulatory Visit: Payer: Medicaid Other | Admitting: Neurology

## 2016-10-28 ENCOUNTER — Encounter: Payer: Self-pay | Admitting: Neurology

## 2016-12-16 ENCOUNTER — Ambulatory Visit: Payer: Medicaid Other | Admitting: Neurology

## 2018-07-11 ENCOUNTER — Other Ambulatory Visit: Payer: Self-pay | Admitting: Internal Medicine

## 2018-07-11 DIAGNOSIS — Z1231 Encounter for screening mammogram for malignant neoplasm of breast: Secondary | ICD-10-CM

## 2018-08-16 ENCOUNTER — Ambulatory Visit: Payer: Self-pay

## 2018-09-21 ENCOUNTER — Other Ambulatory Visit: Payer: Self-pay | Admitting: Internal Medicine

## 2018-09-21 DIAGNOSIS — R921 Mammographic calcification found on diagnostic imaging of breast: Secondary | ICD-10-CM

## 2018-09-29 ENCOUNTER — Ambulatory Visit
Admission: RE | Admit: 2018-09-29 | Discharge: 2018-09-29 | Disposition: A | Discharge status: Home / Self Care | Payer: Medicare Other | Source: Ambulatory Visit | Attending: Internal Medicine | Admitting: Internal Medicine

## 2018-09-29 DIAGNOSIS — R921 Mammographic calcification found on diagnostic imaging of breast: Secondary | ICD-10-CM

## 2020-07-10 ENCOUNTER — Other Ambulatory Visit: Payer: Self-pay

## 2020-07-10 ENCOUNTER — Encounter (HOSPITAL_COMMUNITY): Payer: Self-pay

## 2020-07-10 ENCOUNTER — Emergency Department (HOSPITAL_COMMUNITY)
Admission: EM | Admit: 2020-07-10 | Discharge: 2020-07-10 | Disposition: A | Discharge status: Home / Self Care | Payer: Medicare Other | Attending: Emergency Medicine | Admitting: Emergency Medicine

## 2020-07-10 ENCOUNTER — Emergency Department (HOSPITAL_COMMUNITY): Payer: Medicare Other

## 2020-07-10 DIAGNOSIS — Z951 Presence of aortocoronary bypass graft: Secondary | ICD-10-CM | POA: Diagnosis not present

## 2020-07-10 DIAGNOSIS — Z87891 Personal history of nicotine dependence: Secondary | ICD-10-CM | POA: Insufficient documentation

## 2020-07-10 DIAGNOSIS — N390 Urinary tract infection, site not specified: Secondary | ICD-10-CM | POA: Insufficient documentation

## 2020-07-10 DIAGNOSIS — I2510 Atherosclerotic heart disease of native coronary artery without angina pectoris: Secondary | ICD-10-CM | POA: Insufficient documentation

## 2020-07-10 DIAGNOSIS — I252 Old myocardial infarction: Secondary | ICD-10-CM | POA: Diagnosis not present

## 2020-07-10 DIAGNOSIS — Z79899 Other long term (current) drug therapy: Secondary | ICD-10-CM | POA: Insufficient documentation

## 2020-07-10 DIAGNOSIS — R1084 Generalized abdominal pain: Secondary | ICD-10-CM

## 2020-07-10 DIAGNOSIS — N938 Other specified abnormal uterine and vaginal bleeding: Secondary | ICD-10-CM | POA: Insufficient documentation

## 2020-07-10 DIAGNOSIS — I714 Abdominal aortic aneurysm, without rupture, unspecified: Secondary | ICD-10-CM

## 2020-07-10 DIAGNOSIS — R1030 Lower abdominal pain, unspecified: Secondary | ICD-10-CM | POA: Diagnosis present

## 2020-07-10 DIAGNOSIS — Z7902 Long term (current) use of antithrombotics/antiplatelets: Secondary | ICD-10-CM | POA: Diagnosis not present

## 2020-07-10 LAB — URINALYSIS, ROUTINE W REFLEX MICROSCOPIC
Bilirubin Urine: NEGATIVE
Glucose, UA: NEGATIVE mg/dL
Ketones, ur: NEGATIVE mg/dL
Nitrite: POSITIVE — AB
Protein, ur: 100 mg/dL — AB
RBC / HPF: >50 RBC/hpf — ABNORMAL HIGH (ref 0–5)
Specific Gravity, Urine: 1.012 (ref 1.005–1.030)
WBC, UA: >50 WBC/hpf — ABNORMAL HIGH (ref 0–5)
pH: 5 (ref 5.0–8.0)

## 2020-07-10 LAB — CBC
HCT: 40.8 % (ref 36.0–46.0)
Hemoglobin: 12.1 g/dL (ref 12.0–15.0)
MCH: 30.9 pg (ref 26.0–34.0)
MCHC: 29.7 g/dL — ABNORMAL LOW (ref 30.0–36.0)
MCV: 104.3 fL — ABNORMAL HIGH (ref 80.0–100.0)
Platelets: 188 10*3/uL (ref 150–400)
RBC: 3.91 MIL/uL (ref 3.87–5.11)
RDW: 19 % — ABNORMAL HIGH (ref 11.5–15.5)
WBC: 8.2 10*3/uL (ref 4.0–10.5)
nRBC: 0 % (ref 0.0–0.2)

## 2020-07-10 LAB — COMPREHENSIVE METABOLIC PANEL
ALT: 8 U/L (ref 0–44)
AST: 14 U/L — ABNORMAL LOW (ref 15–41)
Albumin: 3.1 g/dL — ABNORMAL LOW (ref 3.5–5.0)
Alkaline Phosphatase: 99 U/L (ref 38–126)
Anion gap: 10 (ref 5–15)
BUN: 9 mg/dL (ref 8–23)
CO2: 24 mmol/L (ref 22–32)
Calcium: 8.5 mg/dL — ABNORMAL LOW (ref 8.9–10.3)
Chloride: 106 mmol/L (ref 98–111)
Creatinine, Ser: 1.2 mg/dL — ABNORMAL HIGH (ref 0.44–1.00)
GFR calc non Af Amer: 47 mL/min — ABNORMAL LOW (ref >60)
Glucose, Bld: 90 mg/dL (ref 70–99)
Potassium: 4.3 mmol/L (ref 3.5–5.1)
Sodium: 140 mmol/L (ref 135–145)
Total Bilirubin: <0.1 mg/dL — ABNORMAL LOW (ref 0.3–1.2)
Total Protein: 6.5 g/dL (ref 6.5–8.1)

## 2020-07-10 LAB — TYPE AND SCREEN
ABO/RH(D): A POS
Antibody Screen: NEGATIVE

## 2020-07-10 LAB — PROTIME-INR
INR: 1.1 (ref 0.8–1.2)
Prothrombin Time: 13.3 seconds (ref 11.4–15.2)

## 2020-07-10 MED ORDER — ACETAMINOPHEN 325 MG PO TABS
650.0000 mg | ORAL_TABLET | Freq: Once | ORAL | Status: AC
  Administered 2020-07-10: 650 mg via ORAL
  Filled 2020-07-10: qty 2

## 2020-07-10 MED ORDER — CEPHALEXIN 500 MG PO CAPS: 500.0000 mg | ORAL_CAPSULE | Freq: Three times a day (TID) | ORAL | 0 refills | Status: AC

## 2020-07-10 MED ORDER — IOHEXOL 350 MG/ML SOLN
80.0000 mL | Freq: Once | INTRAVENOUS | Status: AC | PRN
  Administered 2020-07-10: 80 mL via INTRAVENOUS

## 2020-07-10 MED ORDER — SODIUM CHLORIDE 0.9 % IV BOLUS
1000.0000 mL | Freq: Once | INTRAVENOUS | Status: AC
  Administered 2020-07-10: 1000 mL via INTRAVENOUS

## 2020-07-10 MED ORDER — MORPHINE SULFATE (PF) 4 MG/ML IV SOLN
4.0000 mg | Freq: Once | INTRAVENOUS | Status: AC
  Administered 2020-07-10: 4 mg via INTRAVENOUS
  Filled 2020-07-10: qty 1

## 2020-07-10 MED ORDER — SODIUM CHLORIDE 0.9 % IV SOLN
1.0000 g | Freq: Once | INTRAVENOUS | Status: AC
  Administered 2020-07-10: 1 g via INTRAVENOUS
  Filled 2020-07-10: qty 10

## 2020-07-10 NOTE — Discharge Instructions (Signed)
You had a CT scan of your abdomen today that showed your aneurysm is stable.  Please follow up closely with your family doctor for recheck.  Get rechecked immediately if you develop fevers, vomiting, severe pain or new concerning symptoms.

## 2020-07-10 NOTE — ED Triage Notes (Signed)
Pt reports light vaginal bleeding since last night with lower abd pain that radiates to her back for the past week. Pt has hx of AAA earlier this year. Pt a.o.

## 2020-07-10 NOTE — ED Provider Notes (Signed)
Patient care assumed at 1600.  CT pending.  CT demonstrates stable aortic aneurysm, chronic vascular changes. UA is consistent with UTI. She was treated with a dose of Rocephin. Discussed with patient findings of studies. Plan to treat for UTI with Keflex. Discussed importance of outpatient PCP follow-up as well as return precautions.   Tilden Fossa, MD 07/10/20 Harrietta Guardian

## 2020-07-10 NOTE — ED Provider Notes (Signed)
[facility] EMERGENCY DEPARTMENT Provider Note   CSN: [id number] Arrival date & time: 07/10/20  1135     History Chief Complaint  Patient presents with  . Vaginal Bleeding  . Back Pain  . Abdominal Pain    [name] is a 64 y.o. female.  HPI 64 year old female presents with lower abdominal pain and blood on the toilet paper.  She states that for the last 2 days has been having dysuria and urinary frequency.  No blood in her urine.  Last night she started having lower abdominal pain and when she wiped yesterday evening after urinating there was a little bit of blood on the toilet paper like she was having vaginal spotting.  She does not have any bleeding when she is not urinating.  Every time she urinates there is a little bit of blood on the toilet paper.  No fevers or new back pain aside from her chronic back pain.  No vomiting.  The pain in her abdomen is about a 6 out of 10.  She is worried because she has been diagnosed with a AAA but has never had pain from it before.  Past Medical History:  Diagnosis Date  . AAA (abdominal aortic aneurysm) (HCC)   . Acid reflux   . Anxiety   . Aortic aneurysm (HCC)   . Carotid artery narrowing   . Claudication (HCC)   . Coronary artery disease   . Depression   . DJD (degenerative joint disease) of lumbar spine   . Myocardial infarction (HCC) 08/05/2011  . Peripheral vascular disease [facility])     Patient Active Problem List   Diagnosis Date Noted  . Aftercare following surgery of the circulatory system, NEC 04/03/2013  . Occlusion and stenosis of carotid artery without mention of cerebral infarction 02/07/2012  . Postsurgical aortocoronary bypass status 09/06/2011  . Wound dehiscence 09/06/2011  . PAD (peripheral artery disease) (HCC) 08/08/2011  . Claudication (HCC) 08/08/2011  . AAA (abdominal aortic aneurysm) (HCC) 08/08/2011  . CAD (coronary artery disease) 08/08/2011    Class: Acute  . Depression 08/08/2011      Class: Chronic  . Fatigue 08/08/2011  . GERD (gastroesophageal reflux disease) 08/08/2011  . Anxiety 08/08/2011  . Tobacco abuse 08/08/2011  . 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 Carotid stenosis, bilateral 08/05/2011    Past Surgical History:  Procedure Laterality Date  . ABDOMINAL HYSTERECTOMY    . Aortogram with runoff  04/27/2011   Bilateral runoff by Dr. Kenard Gower SURGERY     2000, 2002  . CAROTID ENDARTERECTOMY  02/10/2012   Right  cea  . CHOLECYSTECTOMY    . CORONARY ARTERY BYPASS GRAFT  08/05/2011   CABG x3 using LIMA to D1, SVG to OM, SVG to PDA, open vein harvest bilateral thighs  . ENDARTERECTOMY  02/10/2012   Procedure: ENDARTERECTOMY CAROTID;  Surgeon: Pryor Ochoa, MD;  Location: [facility] OR;  Service: Vascular;  Laterality: Right;  . SPINE SURGERY  2000 & 2002   lumbar laminectiomies     OB History   No obstetric history on file.     Family History  Problem Relation Age of Onset  . Diabetes Mother   . Heart disease Father   . Heart attack Father   . Diabetes Sister   . Anesthesia problems Neg Hx     Social History   Tobacco Use  . Smoking status: Former Smoker    Packs/day:  0.50    Years: 30.00    Pack years: 15.00    Types: Cigarettes    Quit date: 08/05/2011    Years since quitting: 8.9  . Smokeless tobacco: Never Used  Substance Use Topics  . Alcohol use: No  . Drug use: No    Home Medications Prior to Admission medications   Medication Sig Start Date End Date Taking? Authorizing Provider  alendronate (FOSAMAX) 70 MG tablet Take 70 mg by mouth once a week. 06/07/20  Yes [provider]  allopurinol (ZYLOPRIM) 300 MG tablet Take 300 mg by mouth daily. 06/22/20  Yes [provider]  BELSOMRA 20 MG TABS Take 1 tablet by mouth at bedtime. 06/20/20  Yes [provider]  citalopram (CELEXA) 20 MG tablet Take 20 mg by mouth daily. 06/19/20  Yes [provider]  clopidogrel (PLAVIX) 75 MG tablet Take 75  mg by mouth daily. 06/17/20  Yes [provider]  esomeprazole (NEXIUM) 40 MG capsule Take 40 mg by mouth daily. 06/17/20  Yes [provider]  HYDROcodone-acetaminophen (NORCO) 10-325 MG tablet Take 1 tablet by mouth 4 (four) times daily. 06/19/20  Yes [provider]  lisinopril (ZESTRIL) 20 MG tablet Take 20 mg by mouth daily. 06/12/20  Yes [provider]  TOPROL XL 25 MG 24 hr tablet TAKE 1/2 TABLET BY MOUTH EVERY DAY Patient not taking: Reported on 07/10/2020 07/08/13   Runell Gess, MD    Allergies    Celebrex [celecoxib] and Eggs or egg-derived products  Review of Systems   Review of Systems  Constitutional: Negative for fever.  Gastrointestinal: Positive for abdominal pain. Negative for vomiting.  Genitourinary: Positive for dysuria, frequency and vaginal bleeding. Negative for flank pain and hematuria.  All other systems reviewed and are negative.   Physical Exam Updated Vital Signs BP (!) 139/43   Pulse (!) 47   Temp 98.2 F (36.8 C) (Oral)   Resp 20   Ht 5\' 4"  (1.626 m)   Wt 59 kg   SpO2 90%   BMI 22.31 kg/m   Physical Exam Vitals and nursing note reviewed.  Constitutional:      Appearance: She is well-developed.  HENT:     Head: Normocephalic and atraumatic.     Right Ear: External ear normal.     Left Ear: External ear normal.     Nose: Nose normal.  Eyes:     General:        Right eye: No discharge.        Left eye: No discharge.  Cardiovascular:     Rate and Rhythm: Normal rate and regular rhythm.     Heart sounds: Normal heart sounds.  Pulmonary:     Effort: Pulmonary effort is normal.     Breath sounds: Normal breath sounds.  Abdominal:     Palpations: Abdomen is soft.     Tenderness: There is generalized abdominal tenderness. There is no right CVA tenderness or left CVA tenderness.  Skin:    General: Skin is warm and dry.  Neurological:     Mental Status: She is alert.  Psychiatric:        Mood and Affect:  Mood is not anxious.     ED Results / Procedures / Treatments   Labs (all labs ordered are listed, but only abnormal results are displayed) Labs Reviewed  COMPREHENSIVE METABOLIC PANEL - Abnormal; Notable for the following components:      Result Value   Creatinine, Ser  1.20 (*)    Calcium 8.5 (*)    Albumin 3.1 (*)    AST 14 (*)    Total Bilirubin <0.1 (*)    GFR calc non Af Amer 47 (*)    All other components within normal limits  CBC - Abnormal; Notable for the following components:   MCV 104.3 (*)    MCHC 29.7 (*)    RDW 19.0 (*)    All other components within normal limits  URINALYSIS, ROUTINE W REFLEX MICROSCOPIC - Abnormal; Notable for the following components:   APPearance CLOUDY (*)    Hgb urine dipstick MODERATE (*)    Protein, ur 100 (*)    Nitrite POSITIVE (*)    Leukocytes,Ua LARGE (*)    RBC / HPF >50 (*)    WBC, UA >50 (*)    Bacteria, UA MANY (*)    Non Squamous Epithelial 0-5 (*)    All other components within normal limits  URINE CULTURE  PROTIME-INR  LIPASE, BLOOD  TYPE AND SCREEN    EKG None  Radiology No results found.  Procedures Procedures (including critical care time)  Medications Ordered in ED Medications  acetaminophen (TYLENOL) tablet 650 mg (650 mg Oral Given 07/10/20 1530)  morphine 4 MG/ML injection 4 mg (4 mg Intravenous Given 07/10/20 1530)  sodium chloride 0.9 % bolus 1,000 mL (1,000 mLs Intravenous New Bag/Given 07/10/20 1530)  iohexol (OMNIPAQUE) 350 MG/ML injection 80 mL (80 mLs Intravenous Contrast Given 07/10/20 1507)    ED Course  I have reviewed the triage vital signs and the nursing notes.  Pertinent labs & imaging results that were available during my care of the patient were reviewed by me and considered in my medical decision making (see chart for details).    MDM Rules/Calculators/A&P                          Patient's blood with wiping is likely from an acute urinary tract infection, confirmed on urinalysis.   However with her generalized abdominal pain in the setting of a known aneurysm, will get CT angiography of her abdomen/pelvis.  If this is negative I think she can be treated just for the UTI.  Care to Dr. Madilyn Hook. Final Clinical Impression(s) / ED Diagnoses Final diagnoses:  None    Rx / DC Orders ED Discharge Orders    None       Pricilla Loveless, MD 07/10/20 1557

## 2020-07-12 LAB — URINE CULTURE: Culture: >=100000 — AB

## 2020-07-13 ENCOUNTER — Telehealth: Payer: Self-pay | Admitting: Emergency Medicine

## 2020-07-13 NOTE — Telephone Encounter (Signed)
Post ED Visit - Positive Culture Follow-up: Successful Patient Follow-Up  Culture assessed and recommendations reviewed by:  []  , Pharm.D. []  Enzo Bi, Pharm.D., BCPS AQ-ID []  , Pharm.D., BCPS []  Celedonio Miyamoto, Pharm.D., BCPS []  [place], Garvin Fila.D., BCPS, AAHIVP []  , Pharm.D., BCPS, AAHIVP []  Georgina Pillion, PharmD, BCPS []  , PharmD, BCPS []  [place], PharmD, BCPS []  [address], PharmD  Positive urine culture  []  Patient discharged without antimicrobial prescription and treatment is now indicated [x]  Organism is resistant to prescribed ED discharge antimicrobial []  Patient with positive blood cultures  Changes discussed with ED provider: PA New antibiotic prescription Bactrim DS one PO BID x seven days Called to [facility] [phone])  Contacted patient, date 07/13/20, time 1500   Laurianne Floresca C Colbe Viviano 07/13/2020, 3:16 PM

## 2020-07-13 NOTE — Progress Notes (Signed)
ED Antimicrobial Stewardship Positive Culture Follow Up   [name] is an 64 y.o. female who presented to [facility] on 07/10/2020 with a chief complaint of  Chief Complaint  Patient presents with  . Vaginal Bleeding  . Back Pain  . Abdominal Pain    Recent Results (from the past 720 hour(s))  Urine culture     Status: Abnormal   Collection Time: 07/10/20  1:17 PM   Specimen: Urine, Random  Result Value Ref Range Status   Specimen Description URINE, RANDOM  Final   Special Requests   Final    NONE Performed at [facility], [address] [place], [place] [address]    Culture (A)  Final    >=100,000 COLONIES/mL KLEBSIELLA PNEUMONIAE >=100,000 COLONIES/mL ENTEROBACTER CLOACAE    Report Status 07/12/2020 FINAL  Final   Organism ID, Bacteria KLEBSIELLA PNEUMONIAE (A)  Final   Organism ID, Bacteria ENTEROBACTER CLOACAE (A)  Final      Susceptibility   Enterobacter cloacae - MIC*    CEFAZOLIN >=64 RESISTANT Resistant     CIPROFLOXACIN <=0.25 SENSITIVE Sensitive     GENTAMICIN <=1 SENSITIVE Sensitive     IMIPENEM <=0.25 SENSITIVE Sensitive     NITROFURANTOIN 64 INTERMEDIATE Intermediate     TRIMETH/SULFA <=20 SENSITIVE Sensitive     PIP/TAZO <=4 SENSITIVE Sensitive     * >=100,000 COLONIES/mL ENTEROBACTER CLOACAE   Klebsiella pneumoniae - MIC*    AMPICILLIN >=32 RESISTANT Resistant     CEFAZOLIN <=4 SENSITIVE Sensitive     CEFTRIAXONE <=0.25 SENSITIVE Sensitive     CIPROFLOXACIN <=0.25 SENSITIVE Sensitive     GENTAMICIN <=1 SENSITIVE Sensitive     IMIPENEM 0.5 SENSITIVE Sensitive     NITROFURANTOIN 64 INTERMEDIATE Intermediate     TRIMETH/SULFA <=20 SENSITIVE Sensitive     AMPICILLIN/SULBACTAM 4 SENSITIVE Sensitive     PIP/TAZO <=4 SENSITIVE Sensitive     * >=100,000 COLONIES/mL KLEBSIELLA PNEUMONIAE    [x]  Treated with cephalexin, organism resistant to prescribed antimicrobial  New antibiotic prescription: Bactrim DS 1 tablet BID x 7 days. D/c  cephalexin  ED Provider: , PA   [name] 07/13/2020, 10:36 AM Clinical Pharmacist  [phone])
